# Patient Record
Sex: Male | Born: 1940 | Race: White | Hispanic: No | Marital: Married | Smoking: Former smoker
Health system: Southern US, Community
[De-identification: ages and names within clinical notes are randomized; demographics above are authoritative.]

## PROBLEM LIST (undated history)

## (undated) DIAGNOSIS — F419 Anxiety disorder, unspecified: Secondary | ICD-10-CM

## (undated) DIAGNOSIS — C4490 Unspecified malignant neoplasm of skin, unspecified: Secondary | ICD-10-CM

## (undated) DIAGNOSIS — N400 Enlarged prostate without lower urinary tract symptoms: Secondary | ICD-10-CM

## (undated) DIAGNOSIS — M5126 Other intervertebral disc displacement, lumbar region: Secondary | ICD-10-CM

## (undated) DIAGNOSIS — F329 Major depressive disorder, single episode, unspecified: Secondary | ICD-10-CM

## (undated) DIAGNOSIS — I10 Essential (primary) hypertension: Secondary | ICD-10-CM

## (undated) DIAGNOSIS — K589 Irritable bowel syndrome without diarrhea: Secondary | ICD-10-CM

## (undated) DIAGNOSIS — K449 Diaphragmatic hernia without obstruction or gangrene: Secondary | ICD-10-CM

## (undated) DIAGNOSIS — G454 Transient global amnesia: Secondary | ICD-10-CM

## (undated) DIAGNOSIS — F32A Depression, unspecified: Secondary | ICD-10-CM

## (undated) DIAGNOSIS — G4700 Insomnia, unspecified: Secondary | ICD-10-CM

## (undated) HISTORY — DX: Insomnia, unspecified: G47.00

## (undated) HISTORY — PX: TRANSURETHRAL RESECTION OF PROSTATE: SHX73

## (undated) HISTORY — DX: Benign prostatic hyperplasia without lower urinary tract symptoms: N40.0

## (undated) HISTORY — PX: SKIN CANCER EXCISION: SHX779

## (undated) HISTORY — DX: Other intervertebral disc displacement, lumbar region: M51.26

## (undated) HISTORY — DX: Anxiety disorder, unspecified: F41.9

## (undated) HISTORY — PX: CYSTECTOMY: SUR359

## (undated) HISTORY — DX: Transient global amnesia: G45.4

## (undated) HISTORY — DX: Unspecified malignant neoplasm of skin, unspecified: C44.90

---

## 1998-09-13 ENCOUNTER — Ambulatory Visit (HOSPITAL_COMMUNITY): Admission: RE | Admit: 1998-09-13 | Discharge: 1998-09-13 | Payer: Self-pay | Admitting: Gastroenterology

## 2002-04-23 ENCOUNTER — Emergency Department (HOSPITAL_COMMUNITY): Admission: EM | Admit: 2002-04-23 | Discharge: 2002-04-24 | Payer: Self-pay | Admitting: Emergency Medicine

## 2002-04-23 ENCOUNTER — Encounter: Payer: Self-pay | Admitting: Emergency Medicine

## 2002-04-26 ENCOUNTER — Encounter: Payer: Self-pay | Admitting: Urology

## 2002-04-27 ENCOUNTER — Ambulatory Visit (HOSPITAL_BASED_OUTPATIENT_CLINIC_OR_DEPARTMENT_OTHER): Admission: RE | Admit: 2002-04-27 | Discharge: 2002-04-27 | Payer: Self-pay | Admitting: Urology

## 2002-05-03 ENCOUNTER — Encounter: Payer: Self-pay | Admitting: Urology

## 2002-05-03 ENCOUNTER — Ambulatory Visit (HOSPITAL_BASED_OUTPATIENT_CLINIC_OR_DEPARTMENT_OTHER): Admission: RE | Admit: 2002-05-03 | Discharge: 2002-05-03 | Payer: Self-pay | Admitting: Urology

## 2003-06-28 ENCOUNTER — Ambulatory Visit (HOSPITAL_BASED_OUTPATIENT_CLINIC_OR_DEPARTMENT_OTHER): Admission: RE | Admit: 2003-06-28 | Discharge: 2003-06-28 | Payer: Self-pay | Admitting: *Deleted

## 2003-12-26 ENCOUNTER — Encounter: Admission: RE | Admit: 2003-12-26 | Discharge: 2004-03-25 | Payer: Self-pay | Admitting: Family Medicine

## 2004-01-09 ENCOUNTER — Encounter: Admission: RE | Admit: 2004-01-09 | Discharge: 2004-01-09 | Payer: Self-pay | Admitting: *Deleted

## 2004-05-29 ENCOUNTER — Ambulatory Visit (HOSPITAL_COMMUNITY): Admission: RE | Admit: 2004-05-29 | Discharge: 2004-05-29 | Payer: Self-pay | Admitting: Gastroenterology

## 2005-07-31 ENCOUNTER — Emergency Department (HOSPITAL_COMMUNITY): Admission: EM | Admit: 2005-07-31 | Discharge: 2005-07-31 | Payer: Self-pay | Admitting: Emergency Medicine

## 2010-10-21 ENCOUNTER — Encounter: Payer: Self-pay | Admitting: *Deleted

## 2014-07-31 ENCOUNTER — Observation Stay (HOSPITAL_COMMUNITY)
Admission: EM | Admit: 2014-07-31 | Discharge: 2014-08-01 | Disposition: A | Discharge status: Home / Self Care | Payer: Medicare Other | Attending: Emergency Medicine | Admitting: Emergency Medicine

## 2014-07-31 ENCOUNTER — Encounter (HOSPITAL_COMMUNITY): Payer: Self-pay | Admitting: Family Medicine

## 2014-07-31 ENCOUNTER — Emergency Department (HOSPITAL_COMMUNITY): Payer: Medicare Other

## 2014-07-31 DIAGNOSIS — K449 Diaphragmatic hernia without obstruction or gangrene: Secondary | ICD-10-CM | POA: Insufficient documentation

## 2014-07-31 DIAGNOSIS — R2990 Unspecified symptoms and signs involving the nervous system: Secondary | ICD-10-CM | POA: Diagnosis present

## 2014-07-31 DIAGNOSIS — G454 Transient global amnesia: Secondary | ICD-10-CM | POA: Diagnosis present

## 2014-07-31 DIAGNOSIS — R413 Other amnesia: Principal | ICD-10-CM | POA: Diagnosis present

## 2014-07-31 DIAGNOSIS — I10 Essential (primary) hypertension: Secondary | ICD-10-CM | POA: Insufficient documentation

## 2014-07-31 DIAGNOSIS — R410 Disorientation, unspecified: Secondary | ICD-10-CM | POA: Diagnosis present

## 2014-07-31 DIAGNOSIS — R412 Retrograde amnesia: Secondary | ICD-10-CM | POA: Diagnosis present

## 2014-07-31 DIAGNOSIS — R4182 Altered mental status, unspecified: Secondary | ICD-10-CM | POA: Diagnosis present

## 2014-07-31 HISTORY — DX: Depression, unspecified: F32.A

## 2014-07-31 HISTORY — DX: Irritable bowel syndrome, unspecified: K58.9

## 2014-07-31 HISTORY — DX: Diaphragmatic hernia without obstruction or gangrene: K44.9

## 2014-07-31 HISTORY — DX: Essential (primary) hypertension: I10

## 2014-07-31 HISTORY — DX: Major depressive disorder, single episode, unspecified: F32.9

## 2014-07-31 LAB — URINALYSIS, ROUTINE W REFLEX MICROSCOPIC
Bilirubin Urine: NEGATIVE
Glucose, UA: NEGATIVE mg/dL
Ketones, ur: NEGATIVE mg/dL
Leukocytes, UA: NEGATIVE
NITRITE: NEGATIVE
PROTEIN: NEGATIVE mg/dL
Specific Gravity, Urine: 1.043 — ABNORMAL HIGH (ref 1.005–1.030)
UROBILINOGEN UA: 0.2 mg/dL (ref 0.0–1.0)
pH: 5 (ref 5.0–8.0)

## 2014-07-31 LAB — DIFFERENTIAL
BASOS ABS: 0 10*3/uL (ref 0.0–0.1)
BASOS PCT: 0 % (ref 0–1)
EOS ABS: 0 10*3/uL (ref 0.0–0.7)
EOS PCT: 1 % (ref 0–5)
Lymphocytes Relative: 20 % (ref 12–46)
Lymphs Abs: 1.2 10*3/uL (ref 0.7–4.0)
Monocytes Absolute: 0.7 10*3/uL (ref 0.1–1.0)
Monocytes Relative: 12 % (ref 3–12)
NEUTROS PCT: 67 % (ref 43–77)
Neutro Abs: 4.1 10*3/uL (ref 1.7–7.7)

## 2014-07-31 LAB — URINE MICROSCOPIC-ADD ON

## 2014-07-31 LAB — I-STAT CHEM 8, ED
BUN: 19 mg/dL (ref 6–23)
CREATININE: 1.2 mg/dL (ref 0.50–1.35)
Calcium, Ion: 1.11 mmol/L — ABNORMAL LOW (ref 1.13–1.30)
Chloride: 107 mEq/L (ref 96–112)
Glucose, Bld: 97 mg/dL (ref 70–99)
HCT: 46 % (ref 39.0–52.0)
Hemoglobin: 15.6 g/dL (ref 13.0–17.0)
Potassium: 4.1 mEq/L (ref 3.7–5.3)
SODIUM: 140 meq/L (ref 137–147)
TCO2: 26 mmol/L (ref 0–100)

## 2014-07-31 LAB — CBC
HEMATOCRIT: 43.5 % (ref 39.0–52.0)
HEMOGLOBIN: 15.3 g/dL (ref 13.0–17.0)
MCH: 32 pg (ref 26.0–34.0)
MCHC: 35.2 g/dL (ref 30.0–36.0)
MCV: 91 fL (ref 78.0–100.0)
Platelets: 227 10*3/uL (ref 150–400)
RBC: 4.78 MIL/uL (ref 4.22–5.81)
RDW: 12.8 % (ref 11.5–15.5)
WBC: 6.1 10*3/uL (ref 4.0–10.5)

## 2014-07-31 LAB — COMPREHENSIVE METABOLIC PANEL
ALBUMIN: 3.8 g/dL (ref 3.5–5.2)
ALT: 19 U/L (ref 0–53)
ANION GAP: 11 (ref 5–15)
AST: 23 U/L (ref 0–37)
Alkaline Phosphatase: 55 U/L (ref 39–117)
BILIRUBIN TOTAL: 0.8 mg/dL (ref 0.3–1.2)
BUN: 16 mg/dL (ref 6–23)
CHLORIDE: 104 meq/L (ref 96–112)
CO2: 25 mEq/L (ref 19–32)
CREATININE: 1.13 mg/dL (ref 0.50–1.35)
Calcium: 9.5 mg/dL (ref 8.4–10.5)
GFR, EST AFRICAN AMERICAN: 71 mL/min — AB (ref >90)
GFR, EST NON AFRICAN AMERICAN: 62 mL/min — AB (ref >90)
GLUCOSE: 101 mg/dL — AB (ref 70–99)
Potassium: 4.1 mEq/L (ref 3.7–5.3)
Sodium: 140 mEq/L (ref 137–147)
Total Protein: 6.7 g/dL (ref 6.0–8.3)

## 2014-07-31 LAB — I-STAT TROPONIN, ED: TROPONIN I, POC: 0.01 ng/mL (ref 0.00–0.08)

## 2014-07-31 LAB — TSH: TSH: 1.24 u[IU]/mL (ref 0.350–4.500)

## 2014-07-31 LAB — CBG MONITORING, ED
GLUCOSE-CAPILLARY: 106 mg/dL — AB (ref 70–99)
GLUCOSE-CAPILLARY: 102 mg/dL — AB (ref 70–99)

## 2014-07-31 LAB — PROTIME-INR
INR: 1.09 (ref 0.00–1.49)
PROTHROMBIN TIME: 14.2 s (ref 11.6–15.2)

## 2014-07-31 LAB — APTT: APTT: 27 s (ref 24–37)

## 2014-07-31 MED ORDER — ONDANSETRON HCL 4 MG/2ML IJ SOLN: 4.0000 mg | Freq: Four times a day (QID) | INTRAMUSCULAR | Status: DC | PRN

## 2014-07-31 MED ORDER — ACETAMINOPHEN 325 MG PO TABS: 650.0000 mg | ORAL_TABLET | Freq: Four times a day (QID) | ORAL | Status: DC | PRN

## 2014-07-31 MED ORDER — SODIUM CHLORIDE 0.9 % IV SOLN
INTRAVENOUS | Status: DC
  Administered 2014-07-31: 19:00:00 via INTRAVENOUS

## 2014-07-31 MED ORDER — THIAMINE HCL 100 MG/ML IJ SOLN
100.0000 mg | Freq: Once | INTRAMUSCULAR | Status: AC
  Administered 2014-07-31: 100 mg via INTRAVENOUS
  Filled 2014-07-31: qty 2

## 2014-07-31 MED ORDER — IOHEXOL 350 MG/ML SOLN
50.0000 mL | Freq: Once | INTRAVENOUS | Status: AC | PRN
  Administered 2014-07-31: 50 mL via INTRAVENOUS

## 2014-07-31 MED ORDER — PSYLLIUM 95 % PO PACK
1.0000 | PACK | Freq: Every day | ORAL | Status: DC
  Administered 2014-07-31 – 2014-08-01 (×2): 1 via ORAL
  Filled 2014-07-31 (×7): qty 1

## 2014-07-31 MED ORDER — SERTRALINE HCL 100 MG PO TABS
100.0000 mg | ORAL_TABLET | Freq: Every day | ORAL | Status: DC
  Administered 2014-07-31 – 2014-08-01 (×2): 100 mg via ORAL
  Filled 2014-07-31 (×2): qty 1

## 2014-07-31 MED ORDER — TRAZODONE HCL 50 MG PO TABS
75.0000 mg | ORAL_TABLET | Freq: Every evening | ORAL | Status: DC | PRN
Start: 2014-07-31 — End: 2014-08-01
  Administered 2014-07-31: 75 mg via ORAL
  Filled 2014-07-31: qty 2

## 2014-07-31 MED ORDER — SODIUM CHLORIDE 0.9 % IV SOLN
Freq: Once | INTRAVENOUS | Status: AC
  Administered 2014-07-31: 16:00:00 via INTRAVENOUS

## 2014-07-31 MED ORDER — PROPYLENE GLYCOL 0.6 % OP SOLN: 1.0000 [drp] | Freq: Every day | OPHTHALMIC | Status: DC

## 2014-07-31 MED ORDER — ENOXAPARIN SODIUM 40 MG/0.4ML ~~LOC~~ SOLN
40.0000 mg | SUBCUTANEOUS | Status: DC
  Administered 2014-07-31: 40 mg via SUBCUTANEOUS
  Filled 2014-07-31: qty 0.4

## 2014-07-31 MED ORDER — SODIUM CHLORIDE 0.9 % IJ SOLN: 3.0000 mL | Freq: Two times a day (BID) | INTRAMUSCULAR | Status: DC

## 2014-07-31 MED ORDER — ONDANSETRON HCL 4 MG PO TABS: 4.0000 mg | ORAL_TABLET | Freq: Four times a day (QID) | ORAL | Status: DC | PRN

## 2014-07-31 MED ORDER — ACETAMINOPHEN 650 MG RE SUPP: 650.0000 mg | Freq: Four times a day (QID) | RECTAL | Status: DC | PRN

## 2014-07-31 MED ORDER — POLYVINYL ALCOHOL 1.4 % OP SOLN
1.0000 [drp] | Freq: Every day | OPHTHALMIC | Status: DC
  Administered 2014-07-31: 1 [drp] via OPHTHALMIC
  Filled 2014-07-31: qty 15

## 2014-07-31 MED ORDER — ALUM & MAG HYDROXIDE-SIMETH 200-200-20 MG/5ML PO SUSP: 30.0000 mL | Freq: Four times a day (QID) | ORAL | Status: DC | PRN

## 2014-07-31 NOTE — ED Provider Notes (Signed)
CSN: [id number]     Arrival date & time 07/31/14  1109 History   First MD Initiated Contact with Patient 07/31/14 1133     Chief Complaint  Patient presents with  . Altered Mental Status    Patient is a 73 y.o. male presenting with general illness. The history is provided by the patient and the spouse.  Illness Location:  Global Amnesia Quality:  Unable to remember recent events Severity:  Severe Onset quality:  Sudden Duration:  2 hours Timing:  Constant Progression:  Unchanged Chronicity:  New Context:  Had intercourse with spouse; spouse went to shower, came back and patient was asking "strange questions" Relieved by:  Nothing Worsened by:  Nothing Ineffective treatments:  None tried Associated symptoms: no abdominal pain, no chest pain, no fever, no headaches, no loss of consciousness, no nausea, no rash, no shortness of breath and no vomiting   Risk factors:  HTN   Past Medical History  Diagnosis Date  . Hypertension   . Hernia, hiatal    History reviewed. No pertinent past surgical history.   History reviewed. No pertinent family history.   History  Substance Use Topics  . Smoking status: Never Smoker   . Smokeless tobacco: Not on file  . Alcohol Use: Not on file    Review of Systems  Constitutional: Negative for fever.  Respiratory: Negative for shortness of breath.   Cardiovascular: Negative for chest pain.  Gastrointestinal: Negative for nausea, vomiting and abdominal pain.  Musculoskeletal: Negative for gait problem.  Skin: Negative for rash.  Neurological: Negative for loss of consciousness, weakness, numbness and headaches.  Psychiatric/Behavioral: Positive for confusion.  All other systems reviewed and are negative.   Allergies  Review of patient's allergies indicates not on file.  Home Medications   Prior to Admission medications   Not on File   BP 146/69 mmHg  Pulse 94  Temp(Src) 98 F (36.7 C) (Oral)  Resp 18  SpO2 94%   Physical  Exam  Constitutional: He appears well-developed and well-nourished. No distress.  HENT:  Head: Normocephalic and atraumatic.  Right Ear: External ear normal.  Left Ear: External ear normal.  Mouth/Throat: Oropharynx is clear and moist.  Eyes: EOM are normal. Pupils are equal, round, and reactive to light.  Neck: Normal range of motion.  Cardiovascular: Normal rate and regular rhythm.   Pulmonary/Chest: Effort normal and breath sounds normal. No respiratory distress. He has no wheezes. He has no rales.  Abdominal: Soft. He exhibits no distension. There is no tenderness. There is no rebound and no guarding.  Neurological: He is alert. He has normal strength. GCS eye subscore is 4. GCS verbal subscore is 4. GCS motor subscore is 6.  CN 3-12 intact; Normal finger to nose bilaterally; normal heel to shin bilaterally; repeats statements; when I left the room and returned he felt that he had never seen me before; normal strength and sensation in all extremities; oriented to person, guesses place correctly; unsure of the year  Skin: Skin is warm and dry. No rash noted. He is not diaphoretic.  Psychiatric: He has a normal mood and affect. His mood appears not anxious. His affect is not angry and not blunt. His speech is not slurred. He is not agitated, not hyperactive, not slowed, not withdrawn and not actively hallucinating. He is attentive.  Vitals reviewed.   ED Course  Procedures (including critical care time) Labs Review  Results for orders placed or performed during the hospital encounter of 07/31/14  Protime-INR  Result Value Ref Range   Prothrombin Time 14.2 11.6 - 15.2 seconds   INR 1.09 0.00 - 1.49  APTT  Result Value Ref Range   aPTT 27 24 - 37 seconds  CBC  Result Value Ref Range   WBC 6.1 4.0 - 10.5 K/uL   RBC 4.78 4.22 - 5.81 MIL/uL   Hemoglobin 15.3 13.0 - 17.0 g/dL   HCT 43.5 39.0 - 52.0 %   MCV 91.0 78.0 - 100.0 fL   MCH 32.0 26.0 - 34.0 pg   MCHC 35.2 30.0 - 36.0 g/dL    RDW 12.8 11.5 - 15.5 %   Platelets 227 150 - 400 K/uL  Differential  Result Value Ref Range   Neutrophils Relative % 67 43 - 77 %   Neutro Abs 4.1 1.7 - 7.7 K/uL   Lymphocytes Relative 20 12 - 46 %   Lymphs Abs 1.2 0.7 - 4.0 K/uL   Monocytes Relative 12 3 - 12 %   Monocytes Absolute 0.7 0.1 - 1.0 K/uL   Eosinophils Relative 1 0 - 5 %   Eosinophils Absolute 0.0 0.0 - 0.7 K/uL   Basophils Relative 0 0 - 1 %   Basophils Absolute 0.0 0.0 - 0.1 K/uL  Comprehensive metabolic panel  Result Value Ref Range   Sodium 140 137 - 147 mEq/L   Potassium 4.1 3.7 - 5.3 mEq/L   Chloride 104 96 - 112 mEq/L   CO2 25 19 - 32 mEq/L   Glucose, Bld 101 (H) 70 - 99 mg/dL   BUN 16 6 - 23 mg/dL   Creatinine, Ser 1.13 0.50 - 1.35 mg/dL   Calcium 9.5 8.4 - 10.5 mg/dL   Total Protein 6.7 6.0 - 8.3 g/dL   Albumin 3.8 3.5 - 5.2 g/dL   AST 23 0 - 37 U/L   ALT 19 0 - 53 U/L   Alkaline Phosphatase 55 39 - 117 U/L   Total Bilirubin 0.8 0.3 - 1.2 mg/dL   GFR calc non Af Amer 62 (L) >90 mL/min   GFR calc Af Amer 71 (L) >90 mL/min   Anion gap 11 5 - 15  CBG monitoring, ED  Result Value Ref Range   Glucose-Capillary 106 (H) 70 - 99 mg/dL  CBG monitoring, ED  Result Value Ref Range   Glucose-Capillary 102 (H) 70 - 99 mg/dL  I-stat troponin, ED (not at [facility])  Result Value Ref Range   Troponin i, poc 0.01 0.00 - 0.08 ng/mL   Comment 3          I-stat chem 8, ed  Result Value Ref Range   Sodium 140 137 - 147 mEq/L   Potassium 4.1 3.7 - 5.3 mEq/L   Chloride 107 96 - 112 mEq/L   BUN 19 6 - 23 mg/dL   Creatinine, Ser 1.20 0.50 - 1.35 mg/dL   Glucose, Bld 97 70 - 99 mg/dL   Calcium, Ion 1.11 (L) 1.13 - 1.30 mmol/L   TCO2 26 0 - 100 mmol/L   Hemoglobin 15.6 13.0 - 17.0 g/dL   HCT 46.0 39.0 - 52.0 %    Imaging Review Ct Angio Head W/cm &/or Wo Cm  07/31/2014   CLINICAL DATA:  Code stroke  EXAM: CT ANGIOGRAPHY HEAD AND NECK  TECHNIQUE: Multidetector CT imaging of the head and neck was performed using the  standard protocol during bolus administration of intravenous contrast. Multiplanar CT image reconstructions and MIPs were obtained to evaluate the vascular anatomy. Carotid  stenosis measurements (when applicable) are obtained utilizing NASCET criteria, using the distal internal carotid diameter as the denominator.  CONTRAST:  41mL OMNIPAQUE IOHEXOL 350 MG/ML SOLN  COMPARISON:  CT head 07/31/2014  FINDINGS: CTA HEAD FINDINGS  CT head reported separately.  Left vertebral dominant. Both vertebral arteries patent to the basilar. PICA, superior cerebellar and posterior cerebral arteries are widely patent. Right AICA also noted.  Cavernous carotid widely patent bilaterally with minimal atherosclerotic calcification. No significant carotid stenosis. Anterior and middle cerebral arteries are normal bilaterally without stenosis.  Negative for cerebral aneurysm.  Dural sinuses are clear without thrombosis.  Review of the MIP images confirms the above findings.  CTA NECK FINDINGS  Three vessel aortic arch. Aortic arch negative. Proximal great vessels are widely patent.  Right carotid: Right common carotid widely patent. Right carotid bifurcation normal. No significant atherosclerotic disease. Negative for dissection or carotid stenosis  Left carotid: Left common carotid artery is normal. Left carotid bifurcation is normal. No atherosclerotic disease or dissection noted.  Vertebral arteries: Left vertebral artery dominant. Both vertebral arteries are patent to the basilar without stenosis or dissection.  Cervical spondylosis noted diffusely most prominent at C5-6 and C6-7. No acute bony change.  Review of the MIP images confirms the above findings.  IMPRESSION: Negative CTA of the head  Negative CTA of the neck.  Critical Value/emergent results were called by telephone at the time of interpretation on 07/31/2014 at 1:12 pm to Dr. Collier Salina, Janann Colonel , who verbally acknowledged these results.   Electronically Signed   By: Franchot Gallo M.D.   On: 07/31/2014 13:13   Ct Head Wo Contrast  07/31/2014   CLINICAL DATA:  Sudden onset of confusion.  EXAM: CT HEAD WITHOUT CONTRAST  TECHNIQUE: Contiguous axial images were obtained from the base of the skull through the vertex without intravenous contrast.  COMPARISON:  CT 07/31/2005  FINDINGS: No intra-axial or extra-axial pathologic fluid or blood collection. No mass lesion. No hydrocephalus. No hemorrhage. No acute bony abnormality. Old nasal fractures. Mild mucosal thickening ethmoid or maxillary sinuses.  IMPRESSION: No acute abnormality.   Electronically Signed   By: Marcello Moores  Register   On: 07/31/2014 12:08   Ct Angio Neck W/cm &/or Wo/cm  07/31/2014   CLINICAL DATA:  Code stroke  EXAM: CT ANGIOGRAPHY HEAD AND NECK  TECHNIQUE: Multidetector CT imaging of the head and neck was performed using the standard protocol during bolus administration of intravenous contrast. Multiplanar CT image reconstructions and MIPs were obtained to evaluate the vascular anatomy. Carotid stenosis measurements (when applicable) are obtained utilizing NASCET criteria, using the distal internal carotid diameter as the denominator.  CONTRAST:  72mL OMNIPAQUE IOHEXOL 350 MG/ML SOLN  COMPARISON:  CT head 07/31/2014  FINDINGS: CTA HEAD FINDINGS  CT head reported separately.  Left vertebral dominant. Both vertebral arteries patent to the basilar. PICA, superior cerebellar and posterior cerebral arteries are widely patent. Right AICA also noted.  Cavernous carotid widely patent bilaterally with minimal atherosclerotic calcification. No significant carotid stenosis. Anterior and middle cerebral arteries are normal bilaterally without stenosis.  Negative for cerebral aneurysm.  Dural sinuses are clear without thrombosis.  Review of the MIP images confirms the above findings.  CTA NECK FINDINGS  Three vessel aortic arch. Aortic arch negative. Proximal great vessels are widely patent.  Right carotid: Right common carotid  widely patent. Right carotid bifurcation normal. No significant atherosclerotic disease. Negative for dissection or carotid stenosis  Left carotid: Left common carotid artery is normal. Left  carotid bifurcation is normal. No atherosclerotic disease or dissection noted.  Vertebral arteries: Left vertebral artery dominant. Both vertebral arteries are patent to the basilar without stenosis or dissection.  Cervical spondylosis noted diffusely most prominent at C5-6 and C6-7. No acute bony change.  Review of the MIP images confirms the above findings.  IMPRESSION: Negative CTA of the head  Negative CTA of the neck.  Critical Value/emergent results were called by telephone at the time of interpretation on 07/31/2014 at 1:12 pm to Dr. Collier Salina, Janann Colonel , who verbally acknowledged these results.   Electronically Signed   By: Franchot Gallo M.D.   On: 07/31/2014 13:13    EKG #1 Rate 58, NSR with PACs, PR 147, QTc 486, No ST elevation or depression  EKG #2 Rate 64, NSR with PACs, PR 149, QTc 469, No ST elevation or depression  MDM   Final diagnoses:  Confusion  Amnesia    73 y.o. male with a history of HTN, Hiatal Hernia, Depression. Presents for amnesia and asking "strange questions" per wife. This began suddenly around 10:30am when the wife got out of the shower. They had just had intercourse. Last seen normal around 10am. No focal weakness, coordination problems, speech problems. Denies pain anywhere including headache, neck pain, chest pain. No recent fever. Benign neuro exam. Able to repeat words immediately, unable to remember them after a delay.   He was taken from triage to get a CT head. CT head negative.  Code Stroke called. Neuro saw and evaluated the patient. Recommended EEG, MRI brain w/o contrast, and IV thiamine.   Labs notable for slightly decreased GFR. Otherwise unremarkable. CTA Head and Neck negative.   He was admitted to the hospitalist for further evaluation and management.   This  case managed in conjunction with my attending, Dr. Reather Converse.  Berenice Primas, MD 07/31/14 [phone]

## 2014-07-31 NOTE — ED Notes (Signed)
PT became bradycardic in CT - 38-48.  EKG given to MD.  Pt now ao x 4, though continues to forget staff.

## 2014-07-31 NOTE — Consult Note (Signed)
Consult Reason for Consult:acute onset confusion Referring Physician: Dr Reather Converse  CC: confusion  HPI: Stuart Huynh is an 73 y.o. male with unremarkable past medical history presenting for acute onset of forgetfulness. Patient woke up his normal self, post-coital was noted to be confused and disoriented around 10am. Wife notes he could not recall things that just happened and had difficulty recalling things from the recent past.   Brought to ED, initial head CT normal. CT angio of head and neck were normal.  Past Medical History  Diagnosis Date  . Hypertension   . Hernia, hiatal     History reviewed. No pertinent past surgical history.  History reviewed. No pertinent family history.  Social History:  reports that he has never smoked. He does not have any smokeless tobacco history on file. His alcohol and drug histories are not on file.  Allergies  Allergen Reactions  . Sulfa Antibiotics Rash    Medications: Prior to Admission:  (Not in a hospital admission)  ROS: Out of a complete 14 system review, the patient complains of only the following symptoms, and all other reviewed systems are negative.  Physical Examination: Blood pressure 144/72, pulse 83, temperature 98 F (36.7 C), temperature source Oral, resp. rate 14, SpO2 97 %.  Neurologic Examination Mental Status: Alert, oriented, thought content appropriate.  Oriented to name, location. Unclear on date. Immediate recall 3/3, delayed 0/3. Speech fluent without evidence of aphasia.  Able to follow 3 step commands without difficulty. Cranial Nerves: II: funduscopic exam wnl bilaterally, visual fields grossly normal, pupils equal, round, reactive to light and accommodation III,IV, VI: ptosis not present, extra-ocular motions intact bilaterally V,VII: smile symmetric, facial light touch sensation normal bilaterally VIII: hearing normal bilaterally IX,X: gag reflex present XI: trapezius strength/neck flexion strength normal  bilaterally XII: tongue strength normal  Motor: Right : Upper extremity    Left:     Upper extremity 5/5 deltoid       5/5 deltoid 5/5 biceps      5/5 biceps  5/5 triceps      5/5 triceps 5/5 hand grip      5/5 hand grip  Lower extremity     Lower extremity 5/5 hip flexor      5/5 hip flexor 5/5 quadricep      5/5 quadriceps  5/5 hamstrings     5/5 hamstrings 5/5 plantar flexion       5/5 plantar flexion 5/5 plantar extension     5/5 plantar extension Tone and bulk:normal tone throughout; no atrophy noted Sensory: Pinprick and light touch intact throughout, bilaterally Deep Tendon Reflexes: 2+ and symmetric throughout Plantars: Right: downgoing   Left: downgoing Cerebellar: normal finger-to-nose, normal rapid alternating movements and normal heel-to-shin test Gait: normal gait and station  Laboratory Studies:   Basic Metabolic Panel:  Recent Labs Lab 07/31/14 1147 07/31/14 1239  NA 140 140  K 4.1 4.1  CL 104 107  CO2 25  --   GLUCOSE 101* 97  BUN 16 19  CREATININE 1.13 1.20  CALCIUM 9.5  --     Liver Function Tests:  Recent Labs Lab 07/31/14 1147  AST 23  ALT 19  ALKPHOS 55  BILITOT 0.8  PROT 6.7  ALBUMIN 3.8   No results for input(s): LIPASE, AMYLASE in the last 168 hours. No results for input(s): AMMONIA in the last 168 hours.  CBC:  Recent Labs Lab 07/31/14 1147 07/31/14 1239  WBC 6.1  --   NEUTROABS 4.1  --  HGB 15.3 15.6  HCT 43.5 46.0  MCV 91.0  --   PLT 227  --     Cardiac Enzymes: No results for input(s): CKTOTAL, CKMB, CKMBINDEX, TROPONINI in the last 168 hours.  BNP: Invalid input(s): POCBNP  CBG:  Recent Labs Lab 07/31/14 1124 07/31/14 1159  GLUCAP 106* 102*    Microbiology: No results found for this or any previous visit.  Coagulation Studies:  Recent Labs  07/31/14 1147  LABPROT 14.2  INR 1.09    Urinalysis: No results for input(s): COLORURINE, LABSPEC, PHURINE, GLUCOSEU, HGBUR, BILIRUBINUR, KETONESUR,  PROTEINUR, UROBILINOGEN, NITRITE, LEUKOCYTESUR in the last 168 hours.  Invalid input(s): APPERANCEUR  Lipid Panel:  No results found for: CHOL, TRIG, HDL, CHOLHDL, VLDL, LDLCALC  HgbA1C: No results found for: HGBA1C  Urine Drug Screen:  No results found for: LABOPIA, COCAINSCRNUR, LABBENZ, AMPHETMU, THCU, LABBARB  Alcohol Level: No results for input(s): ETH in the last 168 hours.  Other results:  Imaging: Ct Head Wo Contrast  07/31/2014   CLINICAL DATA:  Sudden onset of confusion.  EXAM: CT HEAD WITHOUT CONTRAST  TECHNIQUE: Contiguous axial images were obtained from the base of the skull through the vertex without intravenous contrast.  COMPARISON:  CT 07/31/2005  FINDINGS: No intra-axial or extra-axial pathologic fluid or blood collection. No mass lesion. No hydrocephalus. No hemorrhage. No acute bony abnormality. Old nasal fractures. Mild mucosal thickening ethmoid or maxillary sinuses.  IMPRESSION: No acute abnormality.   Electronically Signed   By: Marcello Moores  Register   On: 07/31/2014 12:08     Assessment/Plan:  73y/o gentleman presenting for evaluation of acute onset of difficulty with short term memory. Exam overall unremarkable with exception of markedly impaired ability to form new memories. Has mild retrograde amnesia. Symptoms are most consistent with a possible transient global amnesia.   -check MRI brain without contrast -EEG -give IV thiamine 100mg  x 1   Jim Like, DO [facility] [phone]  If 7pm- 7am, please page neurology on call as listed in [place]. 07/31/2014, 1:14 PM

## 2014-07-31 NOTE — ED Notes (Signed)
Resident updating family on plan of care

## 2014-07-31 NOTE — ED Notes (Signed)
Meal tray ordered. Pt updated.

## 2014-07-31 NOTE — ED Notes (Signed)
Neurologist at bedside. 

## 2014-07-31 NOTE — Code Documentation (Addendum)
73 year old presents to [facility] via pvt vehicle with sx of amnesia.  He was triaged to CT scan and code stroke was called by EDP after exam.  On arrival alert  - speech clear - no weakness noted - has amnesia to events of AM - states he does not even remember last pm - wife states he woke in his usual state this AM - she noted the confusion and amnesia post-coital this AM around 1030.  CT done.  NIHSS 2 for questions only.  No other focal deficits noted.  Taken to CTA of head and neck.  Dr. Janann Colonel present.  Post CTA patient is clearing - has some memory return at this point - wife states not quite his baseline but much better.  No acute intervention per Dr. Janann Colonel.  HR now in 30-45 range with some PAC's noted -patient denies sx -EDP aware - handoff to [employer].  Wife updated.

## 2014-07-31 NOTE — ED Notes (Signed)
Per pt family pt had sudden onset of confusion this am around 10:30. sts that she thought he was joking and then realized he didn't know the date and time. Pt confused at triage. No other deficits noted at triage. Pt screened by Dr. Venora Maples at triage and was told not to call code stroke at this time.

## 2014-07-31 NOTE — ED Provider Notes (Signed)
ECG interpretation   Date: 07/31/2014  Rate: 61  Rhythm: normal sinus rhythm  QRS Axis: normal  Intervals: normal  ST/T Wave abnormalities: normal  Conduction Disutrbances: none  Narrative Interpretation: /with PACs  Old EKG Reviewed: no prior ecg available     Hoy Morn, MD 07/31/14 1240

## 2014-07-31 NOTE — H&P (Addendum)
[facility] History and Physical  Stuart Huynh EGB:[id number] DOB: 1941-05-07 DOA: 07/31/2014  Referring physician:  PCP: No primary care provider on file.   Chief Complaint: Amnesia  HPI: Stuart Huynh is a 73 y.o. male  With a past medical history of hypertension, depression, who was in his usual state health until approximately 10:30 this morning where he was noted by his wife to be asking "strange questions." They had intercourse earlier in the morning. His wife stated that he asked questions about events which they have been planning for the last several months and was unlike him to be asking those queastions. This was accompanied with disorientation and inability to recall recent events or even recognize certain family members in pictures. He asked the same question multiple times to family members. His wife also noticed him being a little slower to respond. During my encounter he cannot recall this mornings events however could tell me what he did yesterday evening for Halloween. He is able to recognize his family members who are present at bedside. He denies recent head trauma, fevers, chills, headache, nausea, vomiting. Patient had a head CT in the emergency room which was negative. A code stroke had been called for which neurology was consulted. Dr. Janann Colonel recommended MRI and EEG. Patient otherwise having a nonfocal neurologic examination.                                                                                                                                                                                                     Review of Systems:  Constitutional:  No weight loss, night sweats, Fevers, chills, fatigue.  HEENT:  No headaches, Difficulty swallowing,Tooth/dental problems,Sore throat,  No sneezing, itching, ear ache, nasal congestion, post nasal drip,  Cardio-vascular:  No chest pain, Orthopnea, PND, swelling in lower extremities, anasarca, dizziness,  palpitations  GI:  No heartburn, indigestion, abdominal pain, nausea, vomiting, diarrhea, change in bowel habits, loss of appetite  Resp:  No shortness of breath with exertion or at rest. No excess mucus, no productive cough, No non-productive cough, No coughing up of blood.No change in color of mucus.No wheezing.No chest wall deformity  Skin:  no rash or lesions.  GU:  no dysuria, change in color of urine, no urgency or frequency. No flank pain.  Musculoskeletal:  No joint pain or swelling. No decreased range of motion. No back pain.  Psych:  Patient stating unable to recall events earlier in the day  Past Medical History  Diagnosis Date  . Hypertension   . Hernia, hiatal    History reviewed. No  pertinent past surgical history. Social History:  reports that he has never smoked. He does not have any smokeless tobacco history on file. His alcohol and drug histories are not on file.  Allergies  Allergen Reactions  . Sulfa Antibiotics Rash    History reviewed. No pertinent family history.   Prior to Admission medications   Medication Sig Start Date End Date Taking? Authorizing Provider  clotrimazole (LOTRIMIN) 1 % cream Apply 1 application topically daily as needed (for rash).   Yes Historical Provider, MD  hydrochlorothiazide (HYDRODIURIL) 25 MG tablet Take 12.5 mg by mouth daily.   Yes Historical Provider, MD  Multiple Vitamin (MULTIVITAMIN WITH MINERALS) TABS tablet Take 1 tablet by mouth daily.   Yes Historical Provider, MD  Propylene Glycol (SYSTANE BALANCE) 0.6 % SOLN Apply 1 drop to eye at bedtime.   Yes Historical Provider, MD  psyllium (METAMUCIL) 58.6 % powder Take 1 packet by mouth 5 (five) times daily.   Yes Historical Provider, MD  sertraline (ZOLOFT) 100 MG tablet Take 100 mg by mouth daily.   Yes Historical Provider, MD  traZODone (DESYREL) 150 MG tablet Take 75 mg by mouth at bedtime.   Yes Historical Provider, MD   Physical Exam: Filed Vitals:   07/31/14 1615  07/31/14 1630 07/31/14 1645 07/31/14 1705  BP: 139/86 117/75 140/98 139/72  Pulse: 39 93 45 95  Temp:      TempSrc:      Resp: 15 18 17 18   SpO2: 96% 93% 95% 96%    Wt Readings from Last 3 Encounters:  No data found for Wt    General:  Appears calm and comfortable Eyes: PERRL, normal lids, irises & conjunctiva ENT: grossly normal hearing, lips & tongue Neck: no LAD, masses or thyromegaly Cardiovascular: RRR, no m/r/g. No LE edema. Telemetry: SR, no arrhythmias  Respiratory: CTA bilaterally, no w/r/r. Normal respiratory effort. Abdomen: soft, ntnd Skin: no rash or induration seen on limited exam Musculoskeletal: grossly normal tone BUE/BLE Psychiatric: grossly normal mood and affect, speech fluent and appropriate Neurologic: grossly non-focal.          Labs on Admission:  Basic Metabolic Panel:  Recent Labs Lab 07/31/14 1147 07/31/14 1239  NA 140 140  K 4.1 4.1  CL 104 107  CO2 25  --   GLUCOSE 101* 97  BUN 16 19  CREATININE 1.13 1.20  CALCIUM 9.5  --    Liver Function Tests:  Recent Labs Lab 07/31/14 1147  AST 23  ALT 19  ALKPHOS 55  BILITOT 0.8  PROT 6.7  ALBUMIN 3.8   No results for input(s): LIPASE, AMYLASE in the last 168 hours. No results for input(s): AMMONIA in the last 168 hours. CBC:  Recent Labs Lab 07/31/14 1147 07/31/14 1239  WBC 6.1  --   NEUTROABS 4.1  --   HGB 15.3 15.6  HCT 43.5 46.0  MCV 91.0  --   PLT 227  --    Cardiac Enzymes: No results for input(s): CKTOTAL, CKMB, CKMBINDEX, TROPONINI in the last 168 hours.  BNP (last 3 results) No results for input(s): PROBNP in the last 8760 hours. CBG:  Recent Labs Lab 07/31/14 1124 07/31/14 1159  GLUCAP 106* 102*    Radiological Exams on Admission: Ct Angio Head W/cm &/or Wo Cm  07/31/2014   CLINICAL DATA:  Code stroke  EXAM: CT ANGIOGRAPHY HEAD AND NECK  TECHNIQUE: Multidetector CT imaging of the head and neck was performed using the standard protocol during bolus  administration of intravenous contrast. Multiplanar CT image reconstructions and MIPs were obtained to evaluate the vascular anatomy. Carotid stenosis measurements (when applicable) are obtained utilizing NASCET criteria, using the distal internal carotid diameter as the denominator.  CONTRAST:  67mL OMNIPAQUE IOHEXOL 350 MG/ML SOLN  COMPARISON:  CT head 07/31/2014  FINDINGS: CTA HEAD FINDINGS  CT head reported separately.  Left vertebral dominant. Both vertebral arteries patent to the basilar. PICA, superior cerebellar and posterior cerebral arteries are widely patent. Right AICA also noted.  Cavernous carotid widely patent bilaterally with minimal atherosclerotic calcification. No significant carotid stenosis. Anterior and middle cerebral arteries are normal bilaterally without stenosis.  Negative for cerebral aneurysm.  Dural sinuses are clear without thrombosis.  Review of the MIP images confirms the above findings.  CTA NECK FINDINGS  Three vessel aortic arch. Aortic arch negative. Proximal great vessels are widely patent.  Right carotid: Right common carotid widely patent. Right carotid bifurcation normal. No significant atherosclerotic disease. Negative for dissection or carotid stenosis  Left carotid: Left common carotid artery is normal. Left carotid bifurcation is normal. No atherosclerotic disease or dissection noted.  Vertebral arteries: Left vertebral artery dominant. Both vertebral arteries are patent to the basilar without stenosis or dissection.  Cervical spondylosis noted diffusely most prominent at C5-6 and C6-7. No acute bony change.  Review of the MIP images confirms the above findings.  IMPRESSION: Negative CTA of the head  Negative CTA of the neck.  Critical Value/emergent results were called by telephone at the time of interpretation on 07/31/2014 at 1:12 pm to Dr. Collier Salina, Janann Colonel , who verbally acknowledged these results.   Electronically Signed   By: Franchot Gallo M.D.   On: 07/31/2014 13:13     Ct Head Wo Contrast  07/31/2014   CLINICAL DATA:  Sudden onset of confusion.  EXAM: CT HEAD WITHOUT CONTRAST  TECHNIQUE: Contiguous axial images were obtained from the base of the skull through the vertex without intravenous contrast.  COMPARISON:  CT 07/31/2005  FINDINGS: No intra-axial or extra-axial pathologic fluid or blood collection. No mass lesion. No hydrocephalus. No hemorrhage. No acute bony abnormality. Old nasal fractures. Mild mucosal thickening ethmoid or maxillary sinuses.  IMPRESSION: No acute abnormality.   Electronically Signed   By: Marcello Moores  Register   On: 07/31/2014 12:08   Ct Angio Neck W/cm &/or Wo/cm  07/31/2014   CLINICAL DATA:  Code stroke  EXAM: CT ANGIOGRAPHY HEAD AND NECK  TECHNIQUE: Multidetector CT imaging of the head and neck was performed using the standard protocol during bolus administration of intravenous contrast. Multiplanar CT image reconstructions and MIPs were obtained to evaluate the vascular anatomy. Carotid stenosis measurements (when applicable) are obtained utilizing NASCET criteria, using the distal internal carotid diameter as the denominator.  CONTRAST:  46mL OMNIPAQUE IOHEXOL 350 MG/ML SOLN  COMPARISON:  CT head 07/31/2014  FINDINGS: CTA HEAD FINDINGS  CT head reported separately.  Left vertebral dominant. Both vertebral arteries patent to the basilar. PICA, superior cerebellar and posterior cerebral arteries are widely patent. Right AICA also noted.  Cavernous carotid widely patent bilaterally with minimal atherosclerotic calcification. No significant carotid stenosis. Anterior and middle cerebral arteries are normal bilaterally without stenosis.  Negative for cerebral aneurysm.  Dural sinuses are clear without thrombosis.  Review of the MIP images confirms the above findings.  CTA NECK FINDINGS  Three vessel aortic arch. Aortic arch negative. Proximal great vessels are widely patent.  Right carotid: Right common carotid widely patent. Right carotid  bifurcation normal. No  significant atherosclerotic disease. Negative for dissection or carotid stenosis  Left carotid: Left common carotid artery is normal. Left carotid bifurcation is normal. No atherosclerotic disease or dissection noted.  Vertebral arteries: Left vertebral artery dominant. Both vertebral arteries are patent to the basilar without stenosis or dissection.  Cervical spondylosis noted diffusely most prominent at C5-6 and C6-7. No acute bony change.  Review of the MIP images confirms the above findings.  IMPRESSION: Negative CTA of the head  Negative CTA of the neck.  Critical Value/emergent results were called by telephone at the time of interpretation on 07/31/2014 at 1:12 pm to Dr. Collier Salina, Janann Colonel , who verbally acknowledged these results.   Electronically Signed   By: Franchot Gallo M.D.   On: 07/31/2014 13:13    EKG: Independently reviewed.   Assessment/Plan Principal Problem:   Amnesia (retrograde) Active Problems:   Amnesia   Confusion   Stroke-like symptom   1. Possible transient global amnesia. Family members reporting that patient had been in his usual state of health until this morning where family noticed him to be disoriented, having retrograde amnesia and repeating the same question multiple times. Workup in the emergency department included a CT scan of brain which was unremarkable. He had a non-focal neurologic examination with the rest of his lab work otherwise unremarkable. Patient was seen and evaluated by neurology who has recommended obtaining MRI of brain and EEG. Will place him in overnight observation on continuous cardiac monitoring.  2. History of hypertension. Patient on hydrochlorothiazide therapy at home, having creatinine of 1.2. Will hold diuretic therapy for now, provide gentle IV fluid hydration overnight. Blood pressures otherwise stable with his last blood pressure reading of 131/76 3. Bradycardia. Patient having heart rates in the 40s to 50s in the  emergency department. He is not taking nodal agents, EKG reveals sinus rhythm with presence of PAC's. Will place him on telemetry, repeat EKG in a.m. 4. Major depression. Continue Zoloft 100 mg by mouth daily 5.  DVT prophylaxis Lovenox  Code Status: Full code Family Communication: Spoke with his wife and daughter who were present at bedside Disposition Plan: Do not anticipate he'll require greater than 2 nights hospitalization, will place him in overnight observation  Time spent: 50 min  Kelvin Cellar [facility] Pager [phone]

## 2014-07-31 NOTE — ED Notes (Signed)
Pt remains monitored by blood pressure, pulse ox, and 12 lead. Pts family remains at bedside.  

## 2014-08-01 ENCOUNTER — Observation Stay (HOSPITAL_COMMUNITY): Payer: Medicare Other

## 2014-08-01 DIAGNOSIS — I10 Essential (primary) hypertension: Secondary | ICD-10-CM | POA: Diagnosis not present

## 2014-08-01 DIAGNOSIS — K449 Diaphragmatic hernia without obstruction or gangrene: Secondary | ICD-10-CM | POA: Diagnosis not present

## 2014-08-01 DIAGNOSIS — R413 Other amnesia: Secondary | ICD-10-CM | POA: Diagnosis not present

## 2014-08-01 DIAGNOSIS — R412 Retrograde amnesia: Secondary | ICD-10-CM

## 2014-08-01 DIAGNOSIS — G454 Transient global amnesia: Secondary | ICD-10-CM | POA: Diagnosis present

## 2014-08-01 DIAGNOSIS — I359 Nonrheumatic aortic valve disorder, unspecified: Secondary | ICD-10-CM

## 2014-08-01 DIAGNOSIS — R410 Disorientation, unspecified: Secondary | ICD-10-CM | POA: Diagnosis not present

## 2014-08-01 LAB — CBC
HCT: 39.3 % (ref 39.0–52.0)
HEMOGLOBIN: 13.5 g/dL (ref 13.0–17.0)
MCH: 31.6 pg (ref 26.0–34.0)
MCHC: 34.4 g/dL (ref 30.0–36.0)
MCV: 92 fL (ref 78.0–100.0)
Platelets: 210 10*3/uL (ref 150–400)
RBC: 4.27 MIL/uL (ref 4.22–5.81)
RDW: 13.1 % (ref 11.5–15.5)
WBC: 4.8 10*3/uL (ref 4.0–10.5)

## 2014-08-01 LAB — BASIC METABOLIC PANEL
Anion gap: 11 (ref 5–15)
BUN: 18 mg/dL (ref 6–23)
CHLORIDE: 105 meq/L (ref 96–112)
CO2: 25 meq/L (ref 19–32)
Calcium: 8.7 mg/dL (ref 8.4–10.5)
Creatinine, Ser: 1.1 mg/dL (ref 0.50–1.35)
GFR calc non Af Amer: 64 mL/min — ABNORMAL LOW (ref >90)
GFR, EST AFRICAN AMERICAN: 74 mL/min — AB (ref >90)
GLUCOSE: 91 mg/dL (ref 70–99)
Potassium: 3.7 mEq/L (ref 3.7–5.3)
Sodium: 141 mEq/L (ref 137–147)

## 2014-08-01 MED ORDER — ASPIRIN EC 325 MG PO TBEC
325.0000 mg | DELAYED_RELEASE_TABLET | Freq: Every day | ORAL | Status: DC
  Administered 2014-08-01: 325 mg via ORAL
  Filled 2014-08-01: qty 1

## 2014-08-01 NOTE — Plan of Care (Signed)
Problem: Consults Goal: Skin Care Protocol Initiated - if Braden Score 18 or less If consults are not indicated, leave blank or document N/A Outcome: Not Applicable Date Met:  03/75/43

## 2014-08-01 NOTE — Plan of Care (Signed)
Problem: Discharge Progression Outcomes Goal: Other Discharge Outcomes/Goals Outcome: Completed/Met Date Met:  08/01/14     

## 2014-08-01 NOTE — Progress Notes (Signed)
Subjective: Patient had no new complaints. No overnight adverse events reported. He said no difficulty with short-term memory today.  Objective: Current vital signs: BP 107/51 mmHg  Pulse 38  Temp(Src) 98.2 F (36.8 C) (Oral)  Resp 18  Ht 5\' 11"  (1.803 m)  Wt 77.747 kg (171 lb 6.4 oz)  BMI 23.92 kg/m2  SpO2 95%  Neurologic Exam: Alert and in no acute distress. Patient was well-oriented to time as well as place. There was no expressive nor receptive aphasia. Short-term memory was normal.  MRI of the brain without contrast showed no signs of an acute stroke.small vessel disease type changes were noted.  EEG has been obtained. Results of pending.  Medications: I have reviewed the patient's current medications.  Assessment/Plan: 73 year old man admitted following onset of transient global amnesia, which has since resolved. He has no memory difficulty at this point.  No further neurodiagnostic studies are indicated. Recommend follow-up with his primary physician on routine basis. Also recommend he continue aspirin daily. No objection to discharging patient home at this point  C.R. Nicole Kindred, MD [facility] [phone]  08/01/2014  11:23 AM

## 2014-08-01 NOTE — Discharge Summary (Addendum)
Physician Discharge Summary  SAYLOR SHECKLER MRN: [id number] DOB/AGE: 02-Nov-1940 73 y.o.  PCP: No primary care provider on file.   Admit date: 07/31/2014 Discharge date: 08/01/2014  Discharge Diagnoses:     Amnesia (retrograde) Hypotension    Amnesia   Confusion   Stroke-like symptom Resting bradycardia   Follow-up recommendations Follow-up with PCP in 5-7 days Patient may need an event monitor, for his PACs and resting bradycardia PCP to follow-up on the results of the 2-D echo     Medication List    STOP taking these medications        hydrochlorothiazide 25 MG tablet  Commonly known as:  HYDRODIURIL      TAKE these medications        clotrimazole 1 % cream  Commonly known as:  LOTRIMIN  Apply 1 application topically daily as needed (for rash).     multivitamin with minerals Tabs tablet  Take 1 tablet by mouth daily.     psyllium 58.6 % powder  Commonly known as:  METAMUCIL  Take 1 packet by mouth 5 (five) times daily.     sertraline 100 MG tablet  Commonly known as:  ZOLOFT  Take 100 mg by mouth daily.     SYSTANE BALANCE 0.6 % Soln  Generic drug:  Propylene Glycol  Apply 1 drop to eye at bedtime.     traZODone 150 MG tablet  Commonly known as:  DESYREL  Take 75 mg by mouth at bedtime.        Discharge Condition: *stable  Disposition: Final discharge disposition not confirmed   Consults:  neurology  Significant Diagnostic Studies: Ct Angio Head W/cm &/or Wo Cm  07/31/2014   CLINICAL DATA:  Code stroke  EXAM: CT ANGIOGRAPHY HEAD AND NECK  TECHNIQUE: Multidetector CT imaging of the head and neck was performed using the standard protocol during bolus administration of intravenous contrast. Multiplanar CT image reconstructions and MIPs were obtained to evaluate the vascular anatomy. Carotid stenosis measurements (when applicable) are obtained utilizing NASCET criteria, using the distal internal carotid diameter as the denominator.  CONTRAST:   61mL OMNIPAQUE IOHEXOL 350 MG/ML SOLN  COMPARISON:  CT head 07/31/2014  FINDINGS: CTA HEAD FINDINGS  CT head reported separately.  Left vertebral dominant. Both vertebral arteries patent to the basilar. PICA, superior cerebellar and posterior cerebral arteries are widely patent. Right AICA also noted.  Cavernous carotid widely patent bilaterally with minimal atherosclerotic calcification. No significant carotid stenosis. Anterior and middle cerebral arteries are normal bilaterally without stenosis.  Negative for cerebral aneurysm.  Dural sinuses are clear without thrombosis.  Review of the MIP images confirms the above findings.  CTA NECK FINDINGS  Three vessel aortic arch. Aortic arch negative. Proximal great vessels are widely patent.  Right carotid: Right common carotid widely patent. Right carotid bifurcation normal. No significant atherosclerotic disease. Negative for dissection or carotid stenosis  Left carotid: Left common carotid artery is normal. Left carotid bifurcation is normal. No atherosclerotic disease or dissection noted.  Vertebral arteries: Left vertebral artery dominant. Both vertebral arteries are patent to the basilar without stenosis or dissection.  Cervical spondylosis noted diffusely most prominent at C5-6 and C6-7. No acute bony change.  Review of the MIP images confirms the above findings.  IMPRESSION: Negative CTA of the head  Negative CTA of the neck.  Critical Value/emergent results were called by telephone at the time of interpretation on 07/31/2014 at 1:12 pm to Dr. Jim Like , who verbally acknowledged  these results.   Electronically Signed   By: Franchot Gallo M.D.   On: 07/31/2014 13:13   Ct Head Wo Contrast  07/31/2014   CLINICAL DATA:  Sudden onset of confusion.  EXAM: CT HEAD WITHOUT CONTRAST  TECHNIQUE: Contiguous axial images were obtained from the base of the skull through the vertex without intravenous contrast.  COMPARISON:  CT 07/31/2005  FINDINGS: No intra-axial or  extra-axial pathologic fluid or blood collection. No mass lesion. No hydrocephalus. No hemorrhage. No acute bony abnormality. Old nasal fractures. Mild mucosal thickening ethmoid or maxillary sinuses.  IMPRESSION: No acute abnormality.   Electronically Signed   By: Marcello Moores  Register   On: 07/31/2014 12:08   Ct Angio Neck W/cm &/or Wo/cm  07/31/2014   CLINICAL DATA:  Code stroke  EXAM: CT ANGIOGRAPHY HEAD AND NECK  TECHNIQUE: Multidetector CT imaging of the head and neck was performed using the standard protocol during bolus administration of intravenous contrast. Multiplanar CT image reconstructions and MIPs were obtained to evaluate the vascular anatomy. Carotid stenosis measurements (when applicable) are obtained utilizing NASCET criteria, using the distal internal carotid diameter as the denominator.  CONTRAST:  64mL OMNIPAQUE IOHEXOL 350 MG/ML SOLN  COMPARISON:  CT head 07/31/2014  FINDINGS: CTA HEAD FINDINGS  CT head reported separately.  Left vertebral dominant. Both vertebral arteries patent to the basilar. PICA, superior cerebellar and posterior cerebral arteries are widely patent. Right AICA also noted.  Cavernous carotid widely patent bilaterally with minimal atherosclerotic calcification. No significant carotid stenosis. Anterior and middle cerebral arteries are normal bilaterally without stenosis.  Negative for cerebral aneurysm.  Dural sinuses are clear without thrombosis.  Review of the MIP images confirms the above findings.  CTA NECK FINDINGS  Three vessel aortic arch. Aortic arch negative. Proximal great vessels are widely patent.  Right carotid: Right common carotid widely patent. Right carotid bifurcation normal. No significant atherosclerotic disease. Negative for dissection or carotid stenosis  Left carotid: Left common carotid artery is normal. Left carotid bifurcation is normal. No atherosclerotic disease or dissection noted.  Vertebral arteries: Left vertebral artery dominant. Both  vertebral arteries are patent to the basilar without stenosis or dissection.  Cervical spondylosis noted diffusely most prominent at C5-6 and C6-7. No acute bony change.  Review of the MIP images confirms the above findings.  IMPRESSION: Negative CTA of the head  Negative CTA of the neck.  Critical Value/emergent results were called by telephone at the time of interpretation on 07/31/2014 at 1:12 pm to Dr. Collier Salina, Stuart Huynh , who verbally acknowledged these results.   Electronically Signed   By: Franchot Gallo M.D.   On: 07/31/2014 13:13   Mr Brain Wo Contrast  08/01/2014   CLINICAL DATA:  73 year old hypertensive male presenting with confusion. Subsequent encounter.  EXAM: MRI HEAD WITHOUT CONTRAST  TECHNIQUE: Multiplanar, multiecho pulse sequences of the brain and surrounding structures were obtained without intravenous contrast.  COMPARISON:  07/31/2014 CT and CT angiogram.  No comparison MR.  FINDINGS: No acute infarct.  Mild small vessel disease type changes.  No intracranial hemorrhage.  No intracranial mass lesion noted on this unenhanced exam.  Age related mild atrophy minimal asymmetric greater on the right. No hydrocephalus.  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 Major intracranial vascular structures are patent.  Cervical medullary junction, pituitary region, pineal region and orbital structures unremarkable.  Polypoid mucosal thickening maxillary sinuses with minimal to mild mucosal thickening ethmoid sinus air cells.  IMPRESSION: No acute infarct.  Mild small vessel disease  type changes.  Paranasal sinus mucosal thickening as noted above.   Electronically Signed   By: Chauncey Cruel M.D.   On: 08/01/2014 08:38      Microbiology: No results found for this or any previous visit (from the past 240 hour(s)).   Labs: Results for orders placed or performed during the hospital encounter of 07/31/14 (from the past 48 hour(s))  CBG monitoring, ED     Status: Abnormal   Collection Time: 07/31/14  11:24 AM  Result Value Ref Range   Glucose-Capillary 106 (H) 70 - 99 mg/dL  Protime-INR     Status: None   Collection Time: 07/31/14 11:47 AM  Result Value Ref Range   Prothrombin Time 14.2 11.6 - 15.2 seconds   INR 1.09 0.00 - 1.49  APTT     Status: None   Collection Time: 07/31/14 11:47 AM  Result Value Ref Range   aPTT 27 24 - 37 seconds  CBC     Status: None   Collection Time: 07/31/14 11:47 AM  Result Value Ref Range   WBC 6.1 4.0 - 10.5 K/uL   RBC 4.78 4.22 - 5.81 MIL/uL   Hemoglobin 15.3 13.0 - 17.0 g/dL   HCT 43.5 39.0 - 52.0 %   MCV 91.0 78.0 - 100.0 fL   MCH 32.0 26.0 - 34.0 pg   MCHC 35.2 30.0 - 36.0 g/dL   RDW 12.8 11.5 - 15.5 %   Platelets 227 150 - 400 K/uL  Differential     Status: None   Collection Time: 07/31/14 11:47 AM  Result Value Ref Range   Neutrophils Relative % 67 43 - 77 %   Neutro Abs 4.1 1.7 - 7.7 K/uL   Lymphocytes Relative 20 12 - 46 %   Lymphs Abs 1.2 0.7 - 4.0 K/uL   Monocytes Relative 12 3 - 12 %   Monocytes Absolute 0.7 0.1 - 1.0 K/uL   Eosinophils Relative 1 0 - 5 %   Eosinophils Absolute 0.0 0.0 - 0.7 K/uL   Basophils Relative 0 0 - 1 %   Basophils Absolute 0.0 0.0 - 0.1 K/uL  Comprehensive metabolic panel     Status: Abnormal   Collection Time: 07/31/14 11:47 AM  Result Value Ref Range   Sodium 140 137 - 147 mEq/L   Potassium 4.1 3.7 - 5.3 mEq/L   Chloride 104 96 - 112 mEq/L   CO2 25 19 - 32 mEq/L   Glucose, Bld 101 (H) 70 - 99 mg/dL   BUN 16 6 - 23 mg/dL   Creatinine, Ser 1.13 0.50 - 1.35 mg/dL   Calcium 9.5 8.4 - 10.5 mg/dL   Total Protein 6.7 6.0 - 8.3 g/dL   Albumin 3.8 3.5 - 5.2 g/dL   AST 23 0 - 37 U/L   ALT 19 0 - 53 U/L   Alkaline Phosphatase 55 39 - 117 U/L   Total Bilirubin 0.8 0.3 - 1.2 mg/dL   GFR calc non Af Amer 62 (L) >90 mL/min   GFR calc Af Amer 71 (L) >90 mL/min    Comment: (NOTE) The eGFR has been calculated using the CKD EPI equation. This calculation has not been validated in all clinical  situations. eGFR's persistently <90 mL/min signify possible Chronic Kidney Disease.    Anion gap 11 5 - 15  CBG monitoring, ED     Status: Abnormal   Collection Time: 07/31/14 11:59 AM  Result Value Ref Range   Glucose-Capillary 102 (H) 70 - 99  mg/dL  I-stat chem 8, ed     Status: Abnormal   Collection Time: 07/31/14 12:39 PM  Result Value Ref Range   Sodium 140 137 - 147 mEq/L   Potassium 4.1 3.7 - 5.3 mEq/L   Chloride 107 96 - 112 mEq/L   BUN 19 6 - 23 mg/dL   Creatinine, Ser 1.20 0.50 - 1.35 mg/dL   Glucose, Bld 97 70 - 99 mg/dL   Calcium, Ion 1.11 (L) 1.13 - 1.30 mmol/L   TCO2 26 0 - 100 mmol/L   Hemoglobin 15.6 13.0 - 17.0 g/dL   HCT 46.0 39.0 - 52.0 %  I-stat troponin, ED (not at [facility])     Status: None   Collection Time: 07/31/14  2:36 PM  Result Value Ref Range   Troponin i, poc 0.01 0.00 - 0.08 ng/mL   Comment 3            Comment: Due to the release kinetics of cTnI, a negative result within the first hours of the onset of symptoms does not rule out myocardial infarction with certainty. If myocardial infarction is still suspected, repeat the test at appropriate intervals.   Urinalysis, Routine w reflex microscopic     Status: Abnormal   Collection Time: 07/31/14  6:56 PM  Result Value Ref Range   Color, Urine YELLOW YELLOW   APPearance CLEAR CLEAR   Specific Gravity, Urine 1.043 (H) 1.005 - 1.030   pH 5.0 5.0 - 8.0   Glucose, UA NEGATIVE NEGATIVE mg/dL   Hgb urine dipstick TRACE (A) NEGATIVE   Bilirubin Urine NEGATIVE NEGATIVE   Ketones, ur NEGATIVE NEGATIVE mg/dL   Protein, ur NEGATIVE NEGATIVE mg/dL   Urobilinogen, UA 0.2 0.0 - 1.0 mg/dL   Nitrite NEGATIVE NEGATIVE   Leukocytes, UA NEGATIVE NEGATIVE  Urine microscopic-add on     Status: None   Collection Time: 07/31/14  6:56 PM  Result Value Ref Range   WBC, UA 0-2 <3 WBC/hpf   RBC / HPF 0-2 <3 RBC/hpf  TSH     Status: None   Collection Time: 07/31/14  8:19 PM  Result Value Ref Range   TSH 1.240  0.350 - 4.500 uIU/mL  Basic metabolic panel     Status: Abnormal   Collection Time: 08/01/14  3:29 AM  Result Value Ref Range   Sodium 141 137 - 147 mEq/L   Potassium 3.7 3.7 - 5.3 mEq/L   Chloride 105 96 - 112 mEq/L   CO2 25 19 - 32 mEq/L   Glucose, Bld 91 70 - 99 mg/dL   BUN 18 6 - 23 mg/dL   Creatinine, Ser 1.10 0.50 - 1.35 mg/dL   Calcium 8.7 8.4 - 10.5 mg/dL   GFR calc non Af Amer 64 (L) >90 mL/min   GFR calc Af Amer 74 (L) >90 mL/min    Comment: (NOTE) The eGFR has been calculated using the CKD EPI equation. This calculation has not been validated in all clinical situations. eGFR's persistently <90 mL/min signify possible Chronic Kidney Disease.    Anion gap 11 5 - 15  CBC     Status: None   Collection Time: 08/01/14  3:29 AM  Result Value Ref Range   WBC 4.8 4.0 - 10.5 K/uL   RBC 4.27 4.22 - 5.81 MIL/uL   Hemoglobin 13.5 13.0 - 17.0 g/dL   HCT 39.3 39.0 - 52.0 %   MCV 92.0 78.0 - 100.0 fL   MCH 31.6 26.0 - 34.0 pg  MCHC 34.4 30.0 - 36.0 g/dL   RDW 13.1 11.5 - 15.5 %   Platelets 210 150 - 400 K/uL     Stuart Huynh is a 73 y.o. male With a past medical history of hypertension, depression, who was in his usual state health until approximately 10:30 this morning where he was noted by his wife to be asking "strange questions." They had intercourse earlier in the morning. His wife stated that he asked questions about events which they have been planning for the last several months and was unlike him to be asking those queastions. This was accompanied with disorientation and inability to recall recent events or even recognize certain family members in pictures. He asked the same question multiple times to family members. His wife also noticed him being a little slower to respond. During my encounter he cannot recall this mornings events however could tell me what he did yesterday evening for Halloween. He is able to recognize his family members who are present at bedside.  He denies recent head trauma, fevers, chills, headache, nausea, vomiting. Patient had a head CT in the emergency room which was negative. A code stroke had been called for which neurology was consulted. Dr. Janann Huynh recommended MRI and EEG. Patient otherwise having a nonfocal neurologic examination.  HOSPITAL COURSE:    Possible transient global amnesia. Family members reporting that patient had been in his usual state of health until this morning where family noticed him to be disoriented, having retrograde amnesia and repeating the same question multiple times. Workup in the emergency department included a CT scan of brain which was unremarkable. He had a non-focal neurologic examination with the rest of his lab work otherwise unremarkable. Patient was seen and evaluated by neurology who has recommended obtaining MRI of brainit was negative, CT angiogram of the head and the neck was negative and EEGresults are pending. Discussed with Dr. Nicole Kindred neurology, okay to discharge from a standpoint.   History of hypertension. Patient on hydrochlorothiazide therapy at home,noted to have transient intermittent hypotension with systolic blood pressure as low as 107.also noted to have resting bradycardia with heart rate reading from 39-43. I have recommended to discontinue hydrochlorothiazide  for now, he was started on IV fluid hydration overnight.patient recommended to have ambulatory blood pressure monitoring as well as possibly an event monitor to further evaluate his bradycardia as well as his PACs.   Bradycardia. Patient having heart rates in the 40s to 50s in the emergency department. He is not taking nodal agents, EKG reveals sinus rhythm with presence of PAC's.telemetry overnight continued to show PACs with heart rate in the 38-43 range. 60s when the patient is awake. Recommend holding antihypertensive medication at this time and considering an event monitor.2-D echo performed results  pending.  Major depression. Continue Zoloft 100 mg by mouth daily 5. DVT prophylaxis Lovenox  Discharge Exam:  Blood pressure 107/51, pulse 38, temperature 98.2 F (36.8 C), temperature source Oral, resp. rate 18, height $RemoveBe'5\' 11"'[id number]$  (1.803 m), weight 77.747 kg (171 lb 6.4 oz), SpO2 95 %.   General: Appears calm and comfortable  Eyes: PERRL, normal lids, irises & conjunctiva  ENT: grossly normal hearing, lips & tongue  Neck: no LAD, masses or thyromegaly  Cardiovascular: RRR, no m/r/g. No LE edema.  Telemetry: SR, no arrhythmias   Respiratory: CTA bilaterally, no w/r/r. Normal respiratory effort.  Abdomen: soft, ntnd  Skin: no rash or induration seen on limited exam  Musculoskeletal: grossly normal tone BUE/BLE  Psychiatric:  grossly normal mood and affect, speech fluent and appropriate  Neurologic: grossly non-focal.       Discharge Instructions    Diet - low sodium heart healthy    Complete by:  As directed      Increase activity slowly    Complete by:  As directed            Follow-up Information    Follow up with pcp. Schedule an appointment as soon as possible for a visit in 3 days.      SignedReyne Dumas 08/01/2014, 10:58 AM

## 2014-08-01 NOTE — Plan of Care (Signed)
Problem: Phase I Progression Outcomes Goal: Voiding-avoid urinary catheter unless indicated Outcome: Completed/Met Date Met:  08/01/14

## 2014-08-01 NOTE — Plan of Care (Signed)
Problem: Discharge Progression Outcomes Goal: Other Discharge Outcomes/Goals Outcome: Adequate for Discharge

## 2014-08-01 NOTE — Progress Notes (Signed)
  Echocardiogram 2D Echocardiogram has been performed.  Darlina Sicilian M 08/01/2014, 11:41 AM

## 2014-08-01 NOTE — Plan of Care (Signed)
Problem: Discharge Progression Outcomes Goal: Discharge plan in place and appropriate Outcome: Completed/Met Date Met:  08/01/14 Goal: Pain controlled with appropriate interventions Outcome: Completed/Met Date Met:  08/01/14 Goal: Hemodynamically stable Outcome: Completed/Met Date Met:  12/12/92 Goal: Complications resolved/controlled Outcome: Completed/Met Date Met:  08/01/14 Goal: Tolerating diet Outcome: Completed/Met Date Met:  08/01/14 Goal: Activity appropriate for discharge plan Outcome: Completed/Met Date Met:  08/01/14 Goal: Other Discharge Outcomes/Goals Outcome: Adequate for Discharge

## 2014-08-01 NOTE — Progress Notes (Signed)
EEG Completed; Results Pending  

## 2014-08-01 NOTE — Plan of Care (Signed)
Problem: Phase I Progression Outcomes Goal: Pain controlled with appropriate interventions Outcome: Not Applicable Date Met:  65/65/99

## 2014-08-02 NOTE — Procedures (Signed)
ELECTROENCEPHALOGRAM REPORT  Patient: Stuart Huynh       Room #: 6K86 EEG No. ID: [id number] Age: 73 y.o.        Sex: male Referring Physician: Allyson Sabal Report Date:  08/02/2014        Interpreting Physician: Anthony Sar  History: [name] is an 73 y.o. male who experienced an episode of transient global amnesia on 07/31/2014  Indications for study:  Rule out possible focal seizure disorder.  Technique: This is an 18 channel routine scalp EEG performed at the bedside with bipolar and monopolar montages arranged in accordance to the international 10/20 system of electrode placement.   Description: This EEG recording was performed during wakefulness. Predominant background activity consisted of 9-10 Hz symmetrical alpha rhythm. Photic stimulation produced symmetrical occipital driving response. Hyperventilation was not performed. No epileptiform discharges were recorded. There was no abnormal slowing of cerebral activity.  Interpretation: This is a normal EEG recording during wakefulness.   Rush Farmer M.D. [facility] [phone]

## 2014-08-05 ENCOUNTER — Encounter: Payer: Self-pay | Admitting: *Deleted

## 2014-08-08 ENCOUNTER — Institutional Professional Consult (permissible substitution): Payer: Medicare Other | Admitting: Cardiology

## 2014-08-10 ENCOUNTER — Ambulatory Visit
Admission: RE | Admit: 2014-08-10 | Discharge: 2014-08-10 | Disposition: A | Discharge status: Home / Self Care | Payer: Medicare Other | Source: Ambulatory Visit | Attending: Cardiology | Admitting: Cardiology

## 2014-08-10 ENCOUNTER — Other Ambulatory Visit: Payer: Self-pay | Admitting: Cardiology

## 2014-08-10 DIAGNOSIS — I498 Other specified cardiac arrhythmias: Secondary | ICD-10-CM

## 2014-08-12 ENCOUNTER — Other Ambulatory Visit: Payer: Self-pay | Admitting: *Deleted

## 2014-08-12 DIAGNOSIS — I517 Cardiomegaly: Secondary | ICD-10-CM

## 2014-08-16 ENCOUNTER — Encounter: Payer: Self-pay | Admitting: Cardiology

## 2014-08-29 ENCOUNTER — Other Ambulatory Visit: Payer: Self-pay | Admitting: *Deleted

## 2014-08-29 ENCOUNTER — Ambulatory Visit (HOSPITAL_COMMUNITY): Payer: Medicare Other

## 2014-08-29 DIAGNOSIS — Q211 Atrial septal defect, unspecified: Secondary | ICD-10-CM

## 2014-08-30 ENCOUNTER — Encounter: Payer: Self-pay | Admitting: Cardiovascular Disease

## 2014-08-31 ENCOUNTER — Ambulatory Visit (HOSPITAL_COMMUNITY)
Admission: RE | Admit: 2014-08-31 | Discharge: 2014-08-31 | Disposition: A | Discharge status: Home / Self Care | Payer: Medicare Other | Source: Ambulatory Visit | Attending: Cardiovascular Disease | Admitting: Cardiovascular Disease

## 2014-08-31 ENCOUNTER — Ambulatory Visit (HOSPITAL_COMMUNITY): Payer: Medicare Other

## 2014-08-31 DIAGNOSIS — Q211 Atrial septal defect, unspecified: Secondary | ICD-10-CM

## 2014-08-31 MED ORDER — NITROGLYCERIN 0.4 MG SL SUBL
SUBLINGUAL_TABLET | SUBLINGUAL | Status: AC
  Administered 2014-08-31: 0.4 mg
  Filled 2014-08-31: qty 1

## 2014-08-31 MED ORDER — IOHEXOL 350 MG/ML SOLN
100.0000 mL | Freq: Once | INTRAVENOUS | Status: AC | PRN
  Administered 2014-08-31: 80 mL via INTRAVENOUS

## 2015-01-08 ENCOUNTER — Encounter (HOSPITAL_COMMUNITY): Payer: Self-pay | Admitting: *Deleted

## 2015-01-08 ENCOUNTER — Emergency Department (HOSPITAL_COMMUNITY)
Admission: EM | Admit: 2015-01-08 | Discharge: 2015-01-09 | Disposition: A | Discharge status: Home / Self Care | Payer: Medicare PPO | Attending: Emergency Medicine | Admitting: Emergency Medicine

## 2015-01-08 ENCOUNTER — Emergency Department (HOSPITAL_COMMUNITY): Payer: Medicare PPO

## 2015-01-08 DIAGNOSIS — R001 Bradycardia, unspecified: Secondary | ICD-10-CM

## 2015-01-08 DIAGNOSIS — G454 Transient global amnesia: Secondary | ICD-10-CM | POA: Diagnosis not present

## 2015-01-08 DIAGNOSIS — I10 Essential (primary) hypertension: Secondary | ICD-10-CM | POA: Diagnosis not present

## 2015-01-08 DIAGNOSIS — Z8719 Personal history of other diseases of the digestive system: Secondary | ICD-10-CM | POA: Diagnosis not present

## 2015-01-08 DIAGNOSIS — Z79899 Other long term (current) drug therapy: Secondary | ICD-10-CM | POA: Insufficient documentation

## 2015-01-08 DIAGNOSIS — F329 Major depressive disorder, single episode, unspecified: Secondary | ICD-10-CM | POA: Insufficient documentation

## 2015-01-08 DIAGNOSIS — I639 Cerebral infarction, unspecified: Secondary | ICD-10-CM | POA: Diagnosis present

## 2015-01-08 DIAGNOSIS — Z87891 Personal history of nicotine dependence: Secondary | ICD-10-CM | POA: Diagnosis not present

## 2015-01-08 DIAGNOSIS — R404 Transient alteration of awareness: Secondary | ICD-10-CM | POA: Diagnosis not present

## 2015-01-08 LAB — CBC
HCT: 40.7 % (ref 39.0–52.0)
Hemoglobin: 13.9 g/dL (ref 13.0–17.0)
MCH: 31.2 pg (ref 26.0–34.0)
MCHC: 34.2 g/dL (ref 30.0–36.0)
MCV: 91.5 fL (ref 78.0–100.0)
PLATELETS: 205 10*3/uL (ref 150–400)
RBC: 4.45 MIL/uL (ref 4.22–5.81)
RDW: 13.2 % (ref 11.5–15.5)
WBC: 5 10*3/uL (ref 4.0–10.5)

## 2015-01-08 LAB — I-STAT CHEM 8, ED
BUN: 19 mg/dL (ref 6–23)
Calcium, Ion: 1.16 mmol/L (ref 1.13–1.30)
Chloride: 106 mmol/L (ref 96–112)
Creatinine, Ser: 1.2 mg/dL (ref 0.50–1.35)
GLUCOSE: 165 mg/dL — AB (ref 70–99)
HCT: 43 % (ref 39.0–52.0)
HEMOGLOBIN: 14.6 g/dL (ref 13.0–17.0)
Potassium: 3.9 mmol/L (ref 3.5–5.1)
Sodium: 142 mmol/L (ref 135–145)
TCO2: 21 mmol/L (ref 0–100)

## 2015-01-08 LAB — COMPREHENSIVE METABOLIC PANEL
ALT: 47 U/L (ref 0–53)
ANION GAP: 5 (ref 5–15)
AST: 40 U/L — ABNORMAL HIGH (ref 0–37)
Albumin: 3.4 g/dL — ABNORMAL LOW (ref 3.5–5.2)
Alkaline Phosphatase: 56 U/L (ref 39–117)
BUN: 15 mg/dL (ref 6–23)
CO2: 25 mmol/L (ref 19–32)
Calcium: 8.8 mg/dL (ref 8.4–10.5)
Chloride: 108 mmol/L (ref 96–112)
Creatinine, Ser: 1.23 mg/dL (ref 0.50–1.35)
GFR calc Af Amer: 65 mL/min — ABNORMAL LOW (ref >90)
GFR calc non Af Amer: 56 mL/min — ABNORMAL LOW (ref >90)
Glucose, Bld: 168 mg/dL — ABNORMAL HIGH (ref 70–99)
Potassium: 3.8 mmol/L (ref 3.5–5.1)
Sodium: 138 mmol/L (ref 135–145)
Total Bilirubin: 0.7 mg/dL (ref 0.3–1.2)
Total Protein: 5.7 g/dL — ABNORMAL LOW (ref 6.0–8.3)

## 2015-01-08 LAB — I-STAT TROPONIN, ED: TROPONIN I, POC: 0 ng/mL (ref 0.00–0.08)

## 2015-01-08 LAB — DIFFERENTIAL
BASOS ABS: 0 10*3/uL (ref 0.0–0.1)
Basophils Relative: 0 % (ref 0–1)
Eosinophils Absolute: 0 10*3/uL (ref 0.0–0.7)
Eosinophils Relative: 1 % (ref 0–5)
LYMPHS ABS: 1.3 10*3/uL (ref 0.7–4.0)
Lymphocytes Relative: 26 % (ref 12–46)
MONO ABS: 0.4 10*3/uL (ref 0.1–1.0)
MONOS PCT: 8 % (ref 3–12)
NEUTROS ABS: 3.2 10*3/uL (ref 1.7–7.7)
Neutrophils Relative %: 65 % (ref 43–77)

## 2015-01-08 LAB — PROTIME-INR
INR: 1.06 (ref 0.00–1.49)
Prothrombin Time: 14 seconds (ref 11.6–15.2)

## 2015-01-08 LAB — CBG MONITORING, ED: Glucose-Capillary: 164 mg/dL — ABNORMAL HIGH (ref 70–99)

## 2015-01-08 LAB — APTT: APTT: 26 s (ref 24–37)

## 2015-01-08 NOTE — ED Notes (Signed)
Back from MRI at this time

## 2015-01-08 NOTE — ED Notes (Signed)
  CBG 164  

## 2015-01-08 NOTE — ED Provider Notes (Signed)
CSN: [id number]     Arrival date & time 01/08/15  2221 History  This chart was scribed for Delora Fuel, MD by Hilda Lias, ED Scribe. This patient was seen in room D32C/D32C and the patient's care was started at 11:37 PM.    Chief Complaint  Patient presents with  . Code Stroke    An emergency department physician performed an initial assessment on this suspected stroke patient at 2221. The history is provided by the spouse. No language interpreter was used.     HPI Comments: Stuart Huynh is a 74 y.o. male who presents to the Emergency Department complaining of symptoms of a stroke that occurred 2.5 hours PTA. His wife states that they arrived home from a trip a few hours before symptoms began. His wife states that he was standing up and had a blank expression on his face. When she asked what was wrong and questions about what they did the past couple of days he could not remember anything. His wife states that he had the same symptoms during an episode back in October of 2015.     Past Medical History  Diagnosis Date  . Hypertension   . Depression   . Irritable bowel syndrome (IBS)   . 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 Skin cancer excision Right cheek   Family History  Problem Relation Age of Onset  . Multiple myeloma Mother   . Lung cancer Father   . Alcohol abuse Father   . Leukemia Brother   . Deep vein thrombosis Brother    History  Substance Use Topics  . Smoking status: Former Smoker    Types: Cigarettes  . Smokeless tobacco: Never Used  . Alcohol Use: Not on file     Comment: occasional beer    Review of Systems  Psychiatric/Behavioral: Positive for behavioral problems and confusion.  All other systems reviewed and are negative.     Allergies  Sulfa antibiotics  Home Medications   Prior to Admission medications    Medication Sig Start Date End Date Taking? Authorizing Provider  Multiple Vitamin (MULTIVITAMIN WITH MINERALS) TABS tablet Take 1 tablet by mouth daily.   Yes Historical Provider, MD  sertraline (ZOLOFT) 100 MG tablet Take 100 mg by mouth daily.   Yes Historical Provider, MD  traZODone (DESYREL) 150 MG tablet Take 75 mg by mouth at bedtime.   Yes Historical Provider, MD   BP 154/87 mmHg  Pulse 48  Temp(Src) 98.3 F (36.8 C)  Resp 11  SpO2 97% Physical Exam  Constitutional: He is oriented to person, place, and time. He appears well-developed and well-nourished.  HENT:  Head: Normocephalic and atraumatic.  Eyes: EOM are normal. Pupils are equal, round, and reactive to light.  Neck: Normal range of motion. Neck supple. No JVD present.  No carotid bruits   Cardiovascular: Regular rhythm.   Bradycardic  Pulmonary/Chest: Effort normal and breath sounds normal. He has no wheezes. He has no rales. He exhibits no tenderness.  Abdominal: Soft. Bowel sounds are normal. He exhibits no distension and no mass. There is no tenderness.  Musculoskeletal: Normal range of motion. He exhibits no edema.  Lymphadenopathy:    He has no cervical adenopathy.  Neurological: He is alert and oriented to person, place, and time. No cranial nerve deficit. Coordination normal.  Oriented to person and  place but not time. No motor or sensory deficits.  Skin: Skin is warm and dry. No rash noted.  Psychiatric: He has a normal mood and affect. His behavior is normal.  Nursing note and vitals reviewed.   ED Course  Procedures (including critical care time)  DIAGNOSTIC STUDIES: Oxygen Saturation is 97% on room air, normal by my interpretation.    COORDINATION OF CARE: 11:43 PM Discussed treatment plan with pt at bedside and pt agreed to plan.  12:25 AM Pt's wife states he is improving, but is not back to baseline.   1:08 AM Pt's wife states pt is back to baseline and acting normal.   Labs Review Results  for orders placed or performed during the hospital encounter of 01/08/15  Protime-INR  Result Value Ref Range   Prothrombin Time 14.0 11.6 - 15.2 seconds   INR 1.06 0.00 - 1.49  APTT  Result Value Ref Range   aPTT 26 24 - 37 seconds  CBC  Result Value Ref Range   WBC 5.0 4.0 - 10.5 K/uL   RBC 4.45 4.22 - 5.81 MIL/uL   Hemoglobin 13.9 13.0 - 17.0 g/dL   HCT 40.7 39.0 - 52.0 %   MCV 91.5 78.0 - 100.0 fL   MCH 31.2 26.0 - 34.0 pg   MCHC 34.2 30.0 - 36.0 g/dL   RDW 13.2 11.5 - 15.5 %   Platelets 205 150 - 400 K/uL  Differential  Result Value Ref Range   Neutrophils Relative % 65 43 - 77 %   Neutro Abs 3.2 1.7 - 7.7 K/uL   Lymphocytes Relative 26 12 - 46 %   Lymphs Abs 1.3 0.7 - 4.0 K/uL   Monocytes Relative 8 3 - 12 %   Monocytes Absolute 0.4 0.1 - 1.0 K/uL   Eosinophils Relative 1 0 - 5 %   Eosinophils Absolute 0.0 0.0 - 0.7 K/uL   Basophils Relative 0 0 - 1 %   Basophils Absolute 0.0 0.0 - 0.1 K/uL  Comprehensive metabolic panel  Result Value Ref Range   Sodium 138 135 - 145 mmol/L   Potassium 3.8 3.5 - 5.1 mmol/L   Chloride 108 96 - 112 mmol/L   CO2 25 19 - 32 mmol/L   Glucose, Bld 168 (H) 70 - 99 mg/dL   BUN 15 6 - 23 mg/dL   Creatinine, Ser 1.23 0.50 - 1.35 mg/dL   Calcium 8.8 8.4 - 10.5 mg/dL   Total Protein 5.7 (L) 6.0 - 8.3 g/dL   Albumin 3.4 (L) 3.5 - 5.2 g/dL   AST 40 (H) 0 - 37 U/L   ALT 47 0 - 53 U/L   Alkaline Phosphatase 56 39 - 117 U/L   Total Bilirubin 0.7 0.3 - 1.2 mg/dL   GFR calc non Af Amer 56 (L) >90 mL/min   GFR calc Af Amer 65 (L) >90 mL/min   Anion gap 5 5 - 15  CBG monitoring, ED  Result Value Ref Range   Glucose-Capillary 164 (H) 70 - 99 mg/dL  I-Stat Chem 8, ED  Result Value Ref Range   Sodium 142 135 - 145 mmol/L   Potassium 3.9 3.5 - 5.1 mmol/L   Chloride 106 96 - 112 mmol/L   BUN 19 6 - 23 mg/dL   Creatinine, Ser 1.20 0.50 - 1.35 mg/dL   Glucose, Bld 165 (H) 70 - 99 mg/dL   Calcium, Ion 1.16 1.13 - 1.30 mmol/L   TCO2 21 0 - 100  mmol/L  Hemoglobin 14.6 13.0 - 17.0 g/dL   HCT 43.0 39.0 - 52.0 %  I-stat troponin, ED (not at [facility])  Result Value Ref Range   Troponin i, poc 0.00 0.00 - 0.08 ng/mL   Comment 3           Imaging Review Ct Head (brain) Wo Contrast  01/08/2015   CLINICAL DATA:  Code stroke.  Confusion.  EXAM: CT HEAD WITHOUT CONTRAST  TECHNIQUE: Contiguous axial images were obtained from the base of the skull through the vertex without intravenous contrast.  COMPARISON:  07/31/2014  FINDINGS: Skull and Sinuses:Negative for fracture or destructive process. The mastoids, middle ears, and imaged paranasal sinuses are clear.  Orbits: No acute abnormality.  Brain: No evidence of acute infarction, hemorrhage, hydrocephalus, or mass lesion/mass effect. Stable slightly asymmetric increased extra-axial space around the right cerebral hemisphere.  Page placed on 01/08/2015  at 10:52 pm to Dr. Janann Colonel.  IMPRESSION: No acute intracranial disease.   Electronically Signed   By: Monte Fantasia M.D.   On: 01/08/2015 22:54   Mr Brain Wo Contrast  01/08/2015   CLINICAL DATA:  Altered mental status, verbal repetition beginning at 2145 hours. History of hypertension.  EXAM: MRI HEAD WITHOUT CONTRAST  TECHNIQUE: Multiplanar, multiecho pulse sequences of the brain and surrounding structures were obtained without intravenous contrast.  COMPARISON:  CT of the head January 08, 2015 at 2248 hours and MRI of the brain August 01, 2014  FINDINGS: The ventricles and sulci are normal for patient's age. No abnormal parenchymal signal, mass lesions, mass effect. No reduced diffusion to suggest acute ischemia. No susceptibility artifact to suggest hemorrhage. Stable tiny focus of LEFT mesial temporal lobe susceptibility artifact. Mild patchy supratentorial white matter FLAIR T2 hyperintensities are less than expected for age.  No abnormal extra-axial fluid collections. No extra-axial masses though, contrast enhanced sequences would be more sensitive.  Normal major intracranial vascular flow voids seen at the skull base. Sub cm low signal along the anterior falx may reflect dural calcification or tiny meningioma without mass effect.  Ocular globes and orbital contents are unremarkable though not tailored for evaluation. No abnormal sellar expansion. Mild paranasal sinus mucosal thickening without air-fluid levels. The mastoid air cells are well aerated. No suspicious calvarial bone marrow signal. No abnormal sellar expansion. Craniocervical junction maintained.  IMPRESSION: No acute intracranial process, specifically no acute ischemia.  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   Electronically Signed   By: Elon Alas   On: 01/08/2015 23:56     EKG Interpretation   Date/Time:  Sunday January 08 2015 22:51:06 EDT Ventricular Rate:  46 PR Interval:  151 QRS Duration: 111 QT Interval:  465 QTC Calculation: 407 R Axis:   53 Text Interpretation:  Sinus bradycardia Consider left atrial enlargement  Baseline wander in lead(s) I III aVL When compared with ECG of 08/01/2014,  HEART RATE has increased Confirmed by [facility]  MD, Afshin Chrystal ([id number]) on  01/08/2015 11:25:33 PM      MDM   Final diagnoses:  Transient global amnesia  Sinus bradycardia    Transient global amnesia. Old records are reviewed and he had been hospitalized for evaluation of same in November 2015. Wife stated that her previous episode resolved in about 6 hours. CT and diffusion weighted MRI showed no evidence of new stroke. He was observed in the ED and return to baseline by 1 AM. Case was discussed with neurologist who recommended  outpatient ambulatory EEG. Remainder of workup had been done within the last 6 months and does not need to be repeated.  I personally performed the services described in this documentation, which was scribed in my presence. The recorded information has been reviewed and is accurate.     Delora Fuel, MD 32/76/14 [id number]

## 2015-01-08 NOTE — Code Documentation (Signed)
Stuart Huynh presented to the [facility] this evening by pvt after his wife found him confused.  Both he & his wife flew home tonight from [place], arriving at [place] at 1915, and came directly home.  He called in the order for their dinner & left to go get at 2045.  When he returned he was disoriented and unable to remember what he was doing or the events from earlier in the day.  He had a similar incident in October & had a full stroke workup.  On arrival to triage he was still confused & unable to identify the month.  NIH 1.  He was able to recall the name of the president, what city he lived in, & what his wife's name was.  Plan MRI for further evaluation.

## 2015-01-08 NOTE — Consult Note (Signed)
Stroke Consult    Chief Complaint: confusion  HPI: Stuart Huynh is an 74 y.o. male hx of HTN presenting with acute onset of confusion with inability to form new memories along with unable to recall what happened the last few days. Patient flew back from [place] today, was in normal state of health at 2045. Went to get food, when he returned wife noted he was disoriented and did not recall taking a trip. Upon further questioning, unable to recall taking a trip.   CT head imaging reviewed, is unremarkable. Had similar episode in October, had unremarkable EEG and brain MRI at that time.   Date last known well: 01/08/2015 Time last known well: 2045 tPA Given: No, too mild to treat, NIHSS of 1. DWI negative MRI  Past Medical History  Diagnosis Date  . Hypertension   . Depression   . Irritable bowel syndrome (IBS)   . 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 Skin cancer excision Right cheek    Family History  Problem Relation Age of Onset  . Multiple myeloma Mother   . Lung cancer Father   . Alcohol abuse Father   . Leukemia Brother   . Deep vein thrombosis Brother    Social History:  reports that he has quit smoking. His smoking use included Cigarettes. He has never used smokeless tobacco. He reports that he does not use illicit drugs. His alcohol history is not on file.  Allergies:  Allergies  Allergen Reactions  . Sulfa Antibiotics Rash     (Not in a hospital admission)  ROS: Out of a complete 14 system review, the patient complains of only the following symptoms, and all other reviewed systems are negative. +confusion   Physical Examination: Filed Vitals:   01/08/15 2252  BP:   Pulse:   Temp: 98.3 F (36.8 C)  Resp:    Physical Exam  Constitutional: He appears well-developed and well-nourished.  Psych: Affect appropriate to  situation Eyes: No scleral injection HENT: No OP obstrucion Head: Normocephalic.  Cardiovascular: Normal rate and regular rhythm.  Respiratory: Effort normal and breath sounds normal.  GI: Soft. Bowel sounds are normal. No distension. There is no tenderness.  Skin: WDI   Neurologic Examination: Mental Status: Alert, oriented to name, "Hospital", unsure on date. Difficulty telling me his address, unable to report what he has done today.  Immediate recall 3/3, delayed recall 1/3. Speech fluent without evidence of aphasia.  No dysarthria. Able to follow 3 step commands without difficulty. Cranial Nerves: II: funduscopic exam wnl bilaterally, visual fields grossly normal, pupils equal, round, reactive to light and accommodation III,IV, VI: ptosis not present, extra-ocular motions intact bilaterally V,VII: smile symmetric, facial light touch sensation normal bilaterally VIII: hearing normal bilaterally IX,X: gag reflex present XI: trapezius strength/neck flexion strength normal bilaterally XII: tongue strength normal  Motor: Right : Upper extremity    Left:     Upper extremity 5/5 deltoid       5/5 deltoid 5/5 biceps      5/5 biceps  5/5 triceps      5/5 triceps 5/5 hand grip      5/5 hand grip  Lower extremity     Lower extremity 5/5 hip flexor      5/5 hip flexor 5/5 quadricep      5/5 quadriceps  5/5 hamstrings  5/5 hamstrings 5/5 plantar flexion       5/5 plantar flexion 5/5 plantar extension     5/5 plantar extension Tone and bulk:normal tone throughout; no atrophy noted Sensory: Pinprick and light touch intact throughout, bilaterally Deep Tendon Reflexes: 2+ and symmetric throughout Plantars: Right: downgoing   Left: downgoing Cerebellar: normal finger-to-nose, and normal heel-to-shin test Gait: deferred  Laboratory Studies:   Basic Metabolic Panel: No results for input(s): NA, K, CL, CO2, GLUCOSE, BUN, CREATININE, CALCIUM, MG, PHOS in the last 168 hours.  Liver  Function Tests: No results for input(s): AST, ALT, ALKPHOS, BILITOT, PROT, ALBUMIN in the last 168 hours. No results for input(s): LIPASE, AMYLASE in the last 168 hours. No results for input(s): AMMONIA in the last 168 hours.  CBC: No results for input(s): WBC, NEUTROABS, HGB, HCT, MCV, PLT in the last 168 hours.  Cardiac Enzymes: No results for input(s): CKTOTAL, CKMB, CKMBINDEX, TROPONINI in the last 168 hours.  BNP: Invalid input(s): POCBNP  CBG:  Recent Labs Lab 01/08/15 2249  GLUCAP 164*    Microbiology: No results found for this or any previous visit.  Coagulation Studies: No results for input(s): LABPROT, INR in the last 72 hours.  Urinalysis: No results for input(s): COLORURINE, LABSPEC, PHURINE, GLUCOSEU, HGBUR, BILIRUBINUR, KETONESUR, PROTEINUR, UROBILINOGEN, NITRITE, LEUKOCYTESUR in the last 168 hours.  Invalid input(s): APPERANCEUR  Lipid Panel:  No results found for: CHOL, TRIG, HDL, CHOLHDL, VLDL, LDLCALC  HgbA1C: No results found for: HGBA1C  Urine Drug Screen:  No results found for: LABOPIA, COCAINSCRNUR, LABBENZ, AMPHETMU, THCU, LABBARB  Alcohol Level: No results for input(s): ETH in the last 168 hours.  Other results:  Imaging: Ct Head (brain) Wo Contrast  01/08/2015   CLINICAL DATA:  Code stroke.  Confusion.  EXAM: CT HEAD WITHOUT CONTRAST  TECHNIQUE: Contiguous axial images were obtained from the base of the skull through the vertex without intravenous contrast.  COMPARISON:  07/31/2014  FINDINGS: Skull and Sinuses:Negative for fracture or destructive process. The mastoids, middle ears, and imaged paranasal sinuses are clear.  Orbits: No acute abnormality.  Brain: No evidence of acute infarction, hemorrhage, hydrocephalus, or mass lesion/mass effect. Stable slightly asymmetric increased extra-axial space around the right cerebral hemisphere.  Page placed on 01/08/2015  at 10:52 pm to Dr. Janann Colonel.  IMPRESSION: No acute intracranial disease.    Electronically Signed   By: Monte Fantasia M.D.   On: 01/08/2015 22:54    Assessment: 74 y.o. male with history of hypertension presenting with acute onset of confusion. Difficulty forming new memories and difficulty with recall of events from the past few days. MRI brain DWI negative for acute stroke. Unclear etiology, question possible transient global amnesia. Had similar episode in October with negative MRI brain and EEG.   -would observe in ED, if mental status improving can discharge home with outpatient neurology follow up -may benefit from prolonged ambulatory EEG as outpatient -would consider cardiology follow up for possible cardiac etiology  Jim Like, DO [facility] [phone]  If 7pm- 7am, please page neurology on call as listed in [place]. 01/08/2015, 11:02 PM

## 2015-01-08 NOTE — ED Notes (Signed)
The pt just arrived back in town from [place] .  His wife had fixed dinner and suddenly he was standing in the middle of the room not reacting verbally approx 2145 last time seen normal.   He has been asking the same questions over and over again since that time.  No pain anywhere no speech .Marland Kitchen Similar episode approx one year ago

## 2015-01-08 NOTE — ED Notes (Signed)
In MRI with patient at this time.

## 2015-01-09 NOTE — Discharge Instructions (Signed)
Talk with your primary care provider about possibly ordering an ambulatory EEG.

## 2015-01-16 ENCOUNTER — Encounter: Payer: Self-pay | Admitting: Neurology

## 2015-01-16 ENCOUNTER — Ambulatory Visit (INDEPENDENT_AMBULATORY_CARE_PROVIDER_SITE_OTHER): Payer: Medicare PPO | Admitting: Neurology

## 2015-01-16 VITALS — BP 118/78 | HR 57 | Resp 16 | Ht 71.0 in | Wt 178.0 lb

## 2015-01-16 DIAGNOSIS — G454 Transient global amnesia: Secondary | ICD-10-CM | POA: Diagnosis not present

## 2015-01-16 NOTE — Progress Notes (Signed)
Subjective:    Patient ID: Stuart Huynh is a 74 y.o. male.  HPI     Star Age, MD, PhD [facility] [address], [address] [place], [place] [address]  Dear Dr. Addison Lank,   I saw your patient, Stuart Huynh, upon your kind request in my neurologic clinic today for initial consultation of his history of transient global amnesia with recurrent episodes. The patient is accompanied by his wife today. As you know, Stuart Huynh is a 74 year old right-handed gentleman with an underlying medical history of anxiety, insomnia, hypertension, mild obstructive sleep apnea, lumbar disc disease, who was recently seen in the emergency room on 01/08/2015 with a episode of staring spell and a prior diagnosis of transient global amnesia. I reviewed his emergency room records from 01/08/2015 as well as prior hospitalization records from his admission from 07/31/2014 through 08/01/2014. At that time he was brought in by his family to the emergency room because of disorientation and difficulty recognizing even family members. He was not able to recall what he had done in the morning and the day before. Head CT in the emergency room was negative for an acute changes. I personally reviewed the noncontrast head CT from 07/31/2014 which per my review did not show any acute changes. Neurological consult was obtained and an MRI brain and EEG were recommended. He was admitted to the hospital. EEG was normal in the awake state. CT angiography head and neck was negative. I reviewed his brain MRI without contrast from 08/01/2014: No acute infarct. Mild small vessel disease type changes. Paranasal sinus mucosal thickening as noted above. In addition, personally reviewed the images through the PACS system. 2-D echocardiogram on 08/01/2014 showed EF of 55-60%.  On 01/08/2015 he had a noncontrast head CT in the Emergency room. I personally reviewed the images through the PACS system.  He was observed in  the emergency room and returned back to baseline, after about 4 hours.   While he feels at baseline, he has had some difficulty with motivation and feels "vulnerable". He has been on Zoloft for years and Trazodone 150 mg for sleep for 8-10 years.   His wife provides additional information.   He saw cardiology with w/u including 30 day holter, which per wife was negative.   His Past Medical History Is Significant For: Past Medical History  Diagnosis Date  . Hypertension   . Depression   . Irritable bowel syndrome (IBS)   . Hernia, hiatal     herniated disc in spine, produces numbness in left ankle has had for years per patient  . Transient global amnesia   . Lumbar herniated disc   . BPH (benign prostatic hyperplasia)   . Anxiety   . Insomnia   . Skin cancer     His Past Surgical History Is Significant For: Past Surgical History  Procedure Laterality Date  . Skin cancer excision Right cheek  . Transurethral resection of prostate    . Cystectomy      His Family History Is Significant For: Family History  Problem Relation Age of Onset  . Multiple myeloma Mother   . Lung cancer Father   . Alcohol abuse Father   . Leukemia Brother   . Deep vein thrombosis Brother     His Social History Is Significant For: History   Social History  . Marital Status: Married    Spouse Name: N/A  . Number of Children: 4  . Years of Education: PhD  Occupational History  . Retired    Social History Main Topics  . Smoking status: Former Smoker    Types: Cigarettes  . Smokeless tobacco: Never Used     Comment: Quit in 1968  . Alcohol Use: 0.0 oz/week    0 Standard drinks or equivalent per week     Comment: occasional beer  . Drug Use: No  . Sexual Activity: Not on file     Comment: quit smoking about 50 years ago   Other Topics Concern  . None   Social History Narrative   2 cups of coffee a week     His Allergies Are:  Allergies  Allergen Reactions  . Sulfa Antibiotics  Rash  :   His Current Medications Are:  Outpatient Encounter Prescriptions as of 01/16/2015  Medication Sig  . ferrous fumarate (HEMOCYTE - 106 MG FE) 325 (106 FE) MG TABS tablet Take 1 tablet by mouth.  . Multiple Vitamin (MULTIVITAMIN WITH MINERALS) TABS tablet Take 1 tablet by mouth daily.  . psyllium (METAMUCIL) 58.6 % powder Take 1 packet by mouth 4 (four) times daily as needed.  . sertraline (ZOLOFT) 100 MG tablet Take 100 mg by mouth daily.  . traZODone (DESYREL) 150 MG tablet Take 75 mg by mouth at bedtime.  :   Review of Systems:  Out of a complete 14 point review of systems, all are reviewed and negative with the exception of these symptoms as listed below:   Review of Systems  Constitutional: Positive for fatigue.  HENT: Positive for rhinorrhea.   Respiratory:       Snoring   Genitourinary: Positive for dysuria.       Impotence   Allergic/Immunologic: Positive for environmental allergies.  Neurological: Positive for headaches.       Memory loss, Confusion, Dizziness, Insomnia, Sleepiness, Snoring   Psychiatric/Behavioral:       Depression, Anxiety, Not enough sleep, Decreased energy, Disinterest in activities     Objective:  Neurologic Exam  Physical Exam Physical Examination:   Filed Vitals:   01/16/15 1414  BP: 118/78  Pulse: 57  Resp: 16    General Examination: The patient is a very pleasant 74 y.o. male in no acute distress. He appears well-developed and well-nourished and well groomed.   HEENT: Normocephalic, atraumatic, pupils are equal, round and reactive to light and accommodation. Funduscopic exam is normal with sharp disc margins noted. Extraocular tracking is good without limitation to gaze excursion or nystagmus noted. Normal smooth pursuit is noted. Hearing is grossly intact. Tympanic membranes are clear bilaterally. Face is symmetric with normal facial animation and normal facial sensation. Speech is clear with no dysarthria noted. There is no  hypophonia. There is no lip, neck/head, jaw or voice tremor. Neck is supple with full range of passive and active motion. There are no carotid bruits on auscultation. Oropharynx exam reveals: mild to moderate mouth dryness, adequate dental hygiene and mild airway crowding. Mallampati is class II. Tongue protrudes centrally and palate elevates symmetrically.    Chest: Clear to auscultation without wheezing, rhonchi or crackles noted.  Heart: S1+S2+0, regular and normal without murmurs, rubs or gallops noted.   Abdomen: Soft, non-tender and non-distended with normal bowel sounds appreciated on auscultation.  Extremities: There is no pitting edema in the distal lower extremities bilaterally. Pedal pulses are intact.  Skin: Warm and dry without trophic changes noted. There are no varicose veins.  Musculoskeletal: exam reveals no obvious joint deformities, tenderness or joint swelling or erythema.  Neurologically:  Mental status: The patient is awake, alert and oriented in all 4 spheres. His immediate and remote memory, attention, language skills and fund of knowledge are appropriate. There is no evidence of aphasia, agnosia, apraxia or anomia. Speech is clear with normal prosody and enunciation. Thought process is linear. Mood is anxious and constricted and affect is blunted.  Cranial nerves II - XII are as described above under HEENT exam. In addition: shoulder shrug is normal with equal shoulder height noted. Motor exam: Normal bulk, strength and tone is noted. There is no drift, tremor or rebound. Romberg is negative. Reflexes are 2+ throughout. Babinski: Toes are flexor bilaterally. Fine motor skills and coordination: intact with normal finger taps, normal hand movements, normal rapid alternating patting, normal foot taps and normal foot agility.  Cerebellar testing: No dysmetria or intention tremor on finger to nose testing. Heel to shin is unremarkable bilaterally. There is no truncal or gait  ataxia.  Sensory exam: intact to light touch, pinprick, vibration, temperature sense in the upper and lower extremities.  Gait, station and balance: He stands easily. No veering to one side is noted. No leaning to one side is noted. Posture is age-appropriate and stance is narrow based. Gait shows normal stride length and normal pace. No problems turning are noted. He turns en bloc. Tandem walk is difficult for him.   Assessment and Plan:   In summary, Stuart Huynh is a very pleasant 74 y.o.-year old male with an underlying medical history of anxiety, insomnia, hypertension, mild obstructive sleep apnea, lumbar disc disease, with a history of transient alteration on awareness. His history and exam are in keeping with TGA/transient global amnesia. His exam and neurological exam are non-focal.  I had a long chat with the patient and his wife about my findings and the diagnosis of TGA, the prognosis and treatment options. We talked about medical treatments and non-pharmacological approaches. There is no clear etiology for this and no specific treatment. We talked about maintaining a healthy lifestyle in general. I encouraged the patient to eat healthy, exercise daily and keep well hydrated, to keep a scheduled bedtime and wake time routine, to not skip any meals and eat healthy snacks in between meals and to have protein with every meal.   As far as further diagnostic testing is concerned, I suggested the following today: EEG and we will consider repeating the brain MRI down the road. I have advised him not to drive for 6 months for safety. While this is not an epileptic seizure disorder we cannot exclude that he may have an episode while driving and it may not be safe for him to drive until we can be more sure that he is not having recurrent episodes. As far as medications are concerned, I recommended the following at this time: no change.  I answered all their questions today and the patient and his wife  were in agreement with the above outlined plan. I would like to see the patient back in 3 months, sooner if the need arises and encouraged them to call with any interim questions, concerns, problems or updates and we will call with his EEG test results.  Thank you very much for allowing me to participate in the care of this nice patient. If I can be of any further assistance to you please do not hesitate to call me at [phone].  Sincerely,   Star Age, MD, PhD

## 2015-01-16 NOTE — Patient Instructions (Addendum)
You may have had more than one episode of TGA: transient global amnesia, during which patients can have some loss of time, typically no stroke like presentation and patients typically continue to talk and move, but have no recollection of the period, which can last for minutes or hours. We don't quite understand the etiology or significance of TGA as yet. Some reports indicate a migraine-like etiology, some others mention a seizure-like presentation or even stroke-like origin, but in essence, the clear origin of this remains unclear and there is no specific treatment known.  We will do another EEG.   Please do not drive for 6 months.   Please drink more water.   Please call 911 or go to the ER if you have sudden onset of staring, confusion, altered personality or one sided weakness, or numbness or slurring of speech.

## 2015-01-23 ENCOUNTER — Ambulatory Visit (INDEPENDENT_AMBULATORY_CARE_PROVIDER_SITE_OTHER): Payer: Medicare PPO

## 2015-01-23 DIAGNOSIS — G454 Transient global amnesia: Secondary | ICD-10-CM

## 2015-01-23 DIAGNOSIS — R404 Transient alteration of awareness: Secondary | ICD-10-CM

## 2015-01-23 NOTE — Procedures (Signed)
     History: Stuart Huynh is a 74 year old gentleman with recurring episodes of staring and episodes of confusion. The patient recently was seen in emergency room on 01/08/2015 with a confusional event. The patient has been told he has transient global amnesia, he is sent for further evaluation.  This is a routine EEG. No skull defects are noted. Medications include ferrous fumarate, multivitamins, Zoloft, and trazodone.  EEG classification: Dysrhythmia grade 1 left frontotemporal  Description of the recording: The background rhythms of this recording consists of a relatively well-modulated medium amplitude alpha rhythm of 8 Hz that is reactive to eye opening and closure. As the record progresses, photic stimulation is performed, this results in a minimal but bilateral photic driving response. Hyperventilation was not performed. As the record continues, intermittent slightly higher amplitude theta frequency bursts are seen emanating from the left frontotemporal area. These are unassociated with spike or spike-wave discharges or sharp transients. EKG monitor shows an irregular heart rhythm with a rate of 66.  Impression: This is an abnormal EEG recording secondary to very mild intermittent theta slowing emanating from the left frontotemporal area. This study suggests a left brain abnormality, but does not clearly reveal epileptiform discharges. Clinical correlation is required. The EKG monitor throughout the recording shows a very irregular heart rhythm.

## 2015-01-23 NOTE — Progress Notes (Signed)
Quick Note:  Pls call patient and advise him, that I would like to schedule a brief FU to discuss current test results, including EEG and where to go to from here. No cause for alarm. No seizure activity seen on EEG, but would like to discuss further in the context of all test results.  15 min FU should suffice.  Star Age, MD, PhD [facility] ([facility]  ______

## 2015-01-24 ENCOUNTER — Telehealth: Payer: Self-pay

## 2015-01-24 NOTE — Telephone Encounter (Signed)
Spoke with patient and he is aware of message below. He made appt for 5/10.

## 2015-01-24 NOTE — Telephone Encounter (Signed)
-----   Message from Star Age, MD sent at 01/23/2015  1:09 PM EDT ----- Pls call patient and advise him, that I would like to schedule a brief FU to discuss current test results, including EEG and where to go to from here. No cause for alarm. No seizure activity seen on EEG, but would like to discuss further in the context of all test results.  15 min FU should suffice.  Star Age, MD, PhD [facility])

## 2015-02-07 ENCOUNTER — Encounter: Payer: Self-pay | Admitting: Neurology

## 2015-02-07 ENCOUNTER — Ambulatory Visit (INDEPENDENT_AMBULATORY_CARE_PROVIDER_SITE_OTHER): Payer: Medicare PPO | Admitting: Neurology

## 2015-02-07 VITALS — BP 149/76 | HR 48 | Resp 16 | Ht 71.0 in | Wt 180.0 lb

## 2015-02-07 DIAGNOSIS — R930 Abnormal findings on diagnostic imaging of skull and head, not elsewhere classified: Secondary | ICD-10-CM | POA: Diagnosis not present

## 2015-02-07 DIAGNOSIS — G454 Transient global amnesia: Secondary | ICD-10-CM | POA: Diagnosis not present

## 2015-02-07 DIAGNOSIS — R9089 Other abnormal findings on diagnostic imaging of central nervous system: Secondary | ICD-10-CM

## 2015-02-07 DIAGNOSIS — R9401 Abnormal electroencephalogram [EEG]: Secondary | ICD-10-CM

## 2015-02-07 DIAGNOSIS — R404 Transient alteration of awareness: Secondary | ICD-10-CM

## 2015-02-07 NOTE — Patient Instructions (Addendum)
We will do a brain MRI with contrast.   We will also want to repeat your EEG down the road.   I will see you back in August.

## 2015-02-07 NOTE — Progress Notes (Signed)
Subjective:    Patient ID: Stuart Huynh is a 74 y.o. male.  HPI     Interim history:   Stuart Huynh is a 74 year old right-handed gentleman with an underlying medical history of anxiety, insomnia, hypertension, mild obstructive sleep apnea, lumbar disc disease, who presents for follow-up consultation of his history of transient global amnesia. The patient is accompanied by his wife again today. I first met him on 01/16/2015 at the request of his primary care physician, at which time the patient reported a diagnosis of transient global amnesia. He was seen in the emergency room on 01/08/2015. I suggested another EEG and consider another MRI but did not order an MRI at the time. He was advised not to drive until we have evidence that he does not have any spells for at least 6 months. He had an EEG on 01/23/2015: This is an abnormal EEG recording secondary to very mild intermittent theta slowing emanating from the left frontotemporal area. This study suggests a left brain abnormality, but does not clearly reveal epileptiform discharges. Clinical correlation is required. The EKG monitor throughout the recording shows a very irregular heart rhythm.  Today, 02/07/2015: He reports, feeling stable. He had brain MRI without contrast on 01/08/2015:No acute intracranial process, specifically no acute ischemia. 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 I personally reviewed the images through the PACS system. He has had no spells of altered consciousness or confusion and no changes in his medical history medications. He has been trying to drink more water.    Previously:  I reviewed his emergency room records from 01/08/2015 as well as prior hospitalization records from his admission from 07/31/2014 through 08/01/2014. At that time he was brought in by his family to the emergency room because of disorientation and difficulty recognizing even family members.  He was not able to recall what he had done in the morning and the day before. Head CT in the emergency room was negative for an acute changes. I personally reviewed the noncontrast head CT from 07/31/2014 which per my review did not show any acute changes. Neurological consult was obtained and an MRI brain and EEG were recommended. He was admitted to the hospital. EEG was normal in the awake state. CT angiography head and neck was negative. I reviewed his brain MRI without contrast from 08/01/2014: No acute infarct. Mild small vessel disease type changes. Paranasal sinus mucosal thickening as noted above. In addition, personally reviewed the images through the PACS system. 2-D echocardiogram on 08/01/2014 showed EF of 55-60%.  On 01/08/2015 he had a noncontrast head CT in the Emergency room. I personally reviewed the images through the PACS system.   He was observed in the emergency room and returned back to baseline, after about 4 hours.   While he feels at baseline, he has had some difficulty with motivation and feels "vulnerable". He has been on Zoloft for years and Trazodone 150 mg for sleep for 8-10 years.   His wife provides additional information.   He saw cardiology with w/u including 30 day holter, which per wife was negative.    His Past Medical History Is Significant For: Past Medical History  Diagnosis Date  . Hypertension   . Depression   . Irritable bowel syndrome (IBS)   . Hernia, hiatal     herniated disc in spine, produces numbness in left ankle has had for years per patient  . Transient global amnesia   .  Lumbar herniated disc   . BPH (benign prostatic hyperplasia)   . Anxiety   . Insomnia   . Skin cancer     His Past Surgical History Is Significant For: Past Surgical History  Procedure Laterality Date  . Skin cancer excision Right cheek  . Transurethral resection of prostate    . Cystectomy      His Family History Is Significant For: Family History  Problem  Relation Age of Onset  . Multiple myeloma Mother   . Lung cancer Father   . Alcohol abuse Father   . Leukemia Brother   . Deep vein thrombosis Brother     His Social History Is Significant For: History   Social History  . Marital Status: Married    Spouse Name: N/A  . Number of Children: 4  . Years of Education: PhD   Occupational History  . Retired    Social History Main Topics  . Smoking status: Former Smoker    Types: Cigarettes  . Smokeless tobacco: Never Used     Comment: Quit in 1968  . Alcohol Use: 0.0 oz/week    0 Standard drinks or equivalent per week     Comment: occasional beer  . Drug Use: No  . Sexual Activity: Not on file     Comment: quit smoking about 50 years ago   Other Topics Concern  . None   Social History Narrative   2 cups of coffee a week     His Allergies Are:  Allergies  Allergen Reactions  . Sulfa Antibiotics Rash  :   His Current Medications Are:  Outpatient Encounter Prescriptions as of 02/07/2015  Medication Sig  . ferrous fumarate (HEMOCYTE - 106 MG FE) 325 (106 FE) MG TABS tablet Take 1 tablet by mouth.  . Multiple Vitamin (MULTIVITAMIN WITH MINERALS) TABS tablet Take 1 tablet by mouth daily.  . psyllium (METAMUCIL) 58.6 % powder Take 1 packet by mouth 4 (four) times daily as needed.  . sertraline (ZOLOFT) 100 MG tablet Take 100 mg by mouth daily.  . traZODone (DESYREL) 150 MG tablet Take 75 mg by mouth at bedtime.   No facility-administered encounter medications on file as of 02/07/2015.  :  Review of Systems:  Out of a complete 14 point review of systems, all are reviewed and negative with the exception of these symptoms as listed below:   Review of Systems  All other systems reviewed and are negative.   Objective:  Neurologic Exam  Physical Exam Physical Examination:   Filed Vitals:   02/07/15 0837  BP: 149/76  Pulse: 48  Resp: 16   General Examination: The patient is a very pleasant 74 y.o. male in no acute  distress. He appears well-developed and well-nourished and well groomed. He is less talkative today.  HEENT: Normocephalic, atraumatic, pupils are equal, round and reactive to light and accommodation. Funduscopic exam is normal with sharp disc margins noted. Extraocular tracking is good without limitation to gaze excursion or nystagmus noted. Normal smooth pursuit is noted. Hearing is grossly intact. Tympanic membranes are clear bilaterally. Face is symmetric with normal facial animation and normal facial sensation. Speech is clear with no dysarthria noted. There is no hypophonia. There is no lip, neck/head, jaw or voice tremor. Neck is supple with full range of passive and active motion. There are no carotid bruits on auscultation. Oropharynx exam reveals: mild to moderate mouth dryness, adequate dental hygiene and mild airway crowding. Mallampati is class II. Tongue protrudes centrally  and palate elevates symmetrically.    Chest: Clear to auscultation without wheezing, rhonchi or crackles noted.  Heart: S1+S2+0, regular and normal without murmurs, rubs or gallops noted.   Abdomen: Soft, non-tender and non-distended with normal bowel sounds appreciated on auscultation.  Extremities: There is trace pitting edema in the distal lower extremities bilaterally. Pedal pulses are intact.  Skin: Warm and dry without trophic changes noted. There are no varicose veins.  Musculoskeletal: exam reveals no obvious joint deformities, tenderness or joint swelling or erythema.   Neurologically:  Mental status: The patient is awake, alert and oriented in all 4 spheres. His immediate and remote memory, attention, language skills and fund of knowledge are appropriate. There is no evidence of aphasia, agnosia, apraxia or anomia. Speech is clear with normal prosody and enunciation. Thought process is linear. Mood is anxious and constricted and affect is blunted.  Cranial nerves II - XII are as described above under  HEENT exam. In addition: shoulder shrug is normal with equal shoulder height noted. Motor exam: Normal bulk, strength and tone is noted. There is no drift, tremor or rebound. Romberg is negative. Reflexes are 2+ throughout. Babinski: Toes are flexor bilaterally. Fine motor skills and coordination: intact with normal finger taps, normal hand movements, normal rapid alternating patting, normal foot taps and normal foot agility.  Cerebellar testing: No dysmetria or intention tremor on finger to nose testing. Heel to shin is unremarkable bilaterally. There is no truncal or gait ataxia.  Sensory exam: intact to light touch in the upper and lower extremities.  Gait, station and balance: He stands easily. No veering to one side is noted. No leaning to one side is noted. Posture is age-appropriate and stance is narrow based. Gait shows normal stride length and normal pace. No problems turning are noted. He turns en bloc. Tandem walk is difficult for him, unchanged.   Assessment and Plan:   In summary, Stuart Huynh is a very pleasant 74 year old male with an underlying medical history of anxiety, insomnia, hypertension, mild obstructive sleep apnea, lumbar disc disease, presents for follow-up consultation of his 2 episodes of transient alteration on awareness. His history and exam are in keeping with TGA/transient global amnesia, even though in most cases of TGA it is limited to a single episode. His exam and neurological exam are non-focal. Workup thus far has shown a tiny area of microhemorrhage in the left mesial temporal lobe, and a mildly abnormal EEG which could very well be in keeping with the area of microhemorrhage. Nevertheless, I would like to repeat his EEG down the road and repeat an MRI with contrast which he has not had before. He has not had any further spells which is reassuring. I have reiterated my recommendation for not driving until 6 months have elapsed since his last spell. I again had a  long chat with the patient and his wife about my findings and the diagnosis of TGA, the prognosis and treatment options. We talked about medical treatments and non-pharmacological approaches. There is no clear etiology for this and no specific treatment. We talked about maintaining a healthy lifestyle in general. I encouraged him to drink more water. He has done better with this lately. As far as further diagnostic testing is concerned, I suggested the following today: MRI brain w contrast and we will consider repeating the EEG down the road.  As far as medications are concerned, I recommended the following at this time: no change.  I answered all their questions  today and the patient and his wife were in agreement with the above outlined plan, although he is reluctant to pursue the MRI. I would like to see the patient back in 3 months, sooner if the need arises and encouraged them to call with any interim questions, concerns, problems or updates and we will call with his MRI test results.  I spent 25 minutes in total face-to-face time with the patient, more than 50% of which was spent in counseling and coordination of care, reviewing test results, reviewing medication and discussing or reviewing the diagnosis of TGA, its prognosis and treatment options.

## 2015-03-02 ENCOUNTER — Telehealth: Payer: Self-pay | Admitting: Neurology

## 2015-03-02 NOTE — Telephone Encounter (Signed)
Patient says he is returning a call.

## 2015-03-02 NOTE — Telephone Encounter (Signed)
We have not called patient, per Larene Beach: [facility] called him he just needs to return their call [phone].   I spoke with patient and he is aware of above information.

## 2015-03-15 ENCOUNTER — Ambulatory Visit
Admission: RE | Admit: 2015-03-15 | Discharge: 2015-03-15 | Disposition: A | Discharge status: Home / Self Care | Payer: Medicare PPO | Source: Ambulatory Visit | Attending: Neurology | Admitting: Neurology

## 2015-03-15 DIAGNOSIS — R9401 Abnormal electroencephalogram [EEG]: Secondary | ICD-10-CM

## 2015-03-15 DIAGNOSIS — G454 Transient global amnesia: Secondary | ICD-10-CM | POA: Diagnosis not present

## 2015-03-15 DIAGNOSIS — R9089 Other abnormal findings on diagnostic imaging of central nervous system: Secondary | ICD-10-CM

## 2015-03-15 MED ORDER — GADOBENATE DIMEGLUMINE 529 MG/ML IV SOLN
17.0000 mL | Freq: Once | INTRAVENOUS | Status: AC | PRN
  Administered 2015-03-15: 17 mL via INTRAVENOUS

## 2015-03-16 NOTE — Progress Notes (Signed)
Quick Note:  Brain MRI w contrast reported as normal. Pls notify patient or wife, thx Star Age, MD, PhD [facility] ([facility]  ______

## 2015-03-17 NOTE — Progress Notes (Signed)
Quick Note:  I called and LMVM home that MRI normal study. They are to call back if needed. ______

## 2015-03-27 ENCOUNTER — Other Ambulatory Visit: Payer: Self-pay

## 2015-05-15 ENCOUNTER — Ambulatory Visit (INDEPENDENT_AMBULATORY_CARE_PROVIDER_SITE_OTHER): Payer: Medicare PPO | Admitting: Neurology

## 2015-05-15 ENCOUNTER — Telehealth: Payer: Self-pay

## 2015-05-15 ENCOUNTER — Encounter: Payer: Self-pay | Admitting: Neurology

## 2015-05-15 VITALS — BP 142/70 | HR 62 | Resp 16 | Ht 71.0 in | Wt 178.0 lb

## 2015-05-15 DIAGNOSIS — R9401 Abnormal electroencephalogram [EEG]: Secondary | ICD-10-CM | POA: Diagnosis not present

## 2015-05-15 DIAGNOSIS — G454 Transient global amnesia: Secondary | ICD-10-CM | POA: Diagnosis not present

## 2015-05-15 DIAGNOSIS — R404 Transient alteration of awareness: Secondary | ICD-10-CM | POA: Diagnosis not present

## 2015-05-15 NOTE — Progress Notes (Signed)
Subjective:    Patient ID: Stuart Huynh is a 74 y.o. male.  HPI     Interim history:   Stuart Huynh is a 74 year old right-handed gentleman with an underlying medical history of anxiety, insomnia, hypertension, mild obstructive sleep apnea, lumbar disc disease, who presents for follow-up consultation of his history of transient global amnesia. The patient is unaccompanied today. I last saw him on 02/07/2015, at which time we talked about his brain MRI without contrast. He reported feeling stable. He had brain MRI without contrast on 01/08/2015:No acute intracranial process, specifically no acute ischemia. 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 In particular, he had no spells of altered consciousness or confusion or changes in his recent medication history her medical history. He was trying to drink more water. I advised him again not to drive until he had been spells free for 6 months. I suggested we bring him back for an MRI brain with contrast. He had this on 03/15/2015 and this was reported as normal. We called him with his test results. I suggested we repeat his EEG down the road as well.  Today, 05/15/2015: He reports doing well. He reports no recent changes in his medications. He reports no recent spells of confusion. He feels fine. He is trying to drink more water. He is active physically. He says he has limited his driving but actually drove himself to this appointment. I did remind him that the recommendation was to not drive until he was 6 months spell-free which would be in October of this year. We had a discussion about driving twice before during his appointments. His wife was there for both other appointments.  Previously:    I first met him on 01/16/2015 at the request of his primary care physician, at which time the patient reported a diagnosis of transient global amnesia. He was seen in the emergency room on 01/08/2015. I  suggested another EEG and consider another MRI but did not order an MRI at the time. He was advised not to drive until we have evidence that he does not have any spells for at least 6 months. He had an EEG on 01/23/2015: This is an abnormal EEG recording secondary to very mild intermittent theta slowing emanating from the left frontotemporal area. This study suggests a left brain abnormality, but does not clearly reveal epileptiform discharges. Clinical correlation is required. The EKG monitor throughout the recording shows a very irregular heart rhythm.   I reviewed his emergency room records from 01/08/2015 as well as prior hospitalization records from his admission from 07/31/2014 through 08/01/2014. At that time he was brought in by his family to the emergency room because of disorientation and difficulty recognizing even family members. He was not able to recall what he had done in the morning and the day before. Head CT in the emergency room was negative for an acute changes. I personally reviewed the noncontrast head CT from 07/31/2014 which per my review did not show any acute changes. Neurological consult was obtained and an MRI brain and EEG were recommended. He was admitted to the hospital. EEG was normal in the awake state. CT angiography head and neck was negative. I reviewed his brain MRI without contrast from 08/01/2014: No acute infarct. Mild small vessel disease type changes. Paranasal sinus mucosal thickening as noted above. In addition, personally reviewed the images through the PACS system. 2-D echocardiogram on 08/01/2014 showed EF of 55-60%.  On 01/08/2015 he had a noncontrast head CT in the Emergency room. I personally reviewed the images through the PACS system.   He was observed in the emergency room and returned back to baseline, after about 4 hours.   While he feels at baseline, he has had some difficulty with motivation and feels "vulnerable". He has been on Zoloft for years and  Trazodone 150 mg for sleep for 8-10 years.   His wife provides additional information.   He saw cardiology with w/u including 30 day holter, which per wife was negative.    His Past Medical History Is Significant For: Past Medical History  Diagnosis Date  . Hypertension   . Depression   . Irritable bowel syndrome (IBS)   . Hernia, hiatal     herniated disc in spine, produces numbness in left ankle has had for years per patient  . Transient global amnesia   . Lumbar herniated disc   . BPH (benign prostatic hyperplasia)   . Anxiety   . Insomnia   . Skin cancer     His Past Surgical History Is Significant For: Past Surgical History  Procedure Laterality Date  . Skin cancer excision Right cheek  . Transurethral resection of prostate    . Cystectomy      His Family History Is Significant For: Family History  Problem Relation Age of Onset  . Multiple myeloma Mother   . Lung cancer Father   . Alcohol abuse Father   . Leukemia Brother   . Deep vein thrombosis Brother     His Social History Is Significant For: Social History   Social History  . Marital Status: Married    Spouse Name: N/A  . Number of Children: 4  . Years of Education: PhD   Occupational History  . Retired    Social History Main Topics  . Smoking status: Former Smoker    Types: Cigarettes  . Smokeless tobacco: Never Used     Comment: Quit in 1968  . Alcohol Use: 0.0 oz/week    0 Standard drinks or equivalent per week     Comment: occasional beer  . Drug Use: No  . Sexual Activity: Not Asked     Comment: quit smoking about 50 years ago   Other Topics Concern  . None   Social History Narrative   2 cups of coffee a week     His Allergies Are:  Allergies  Allergen Reactions  . Sulfa Antibiotics Rash  :   His Current Medications Are:  Outpatient Encounter Prescriptions as of 05/15/2015  Medication Sig  . ferrous fumarate (HEMOCYTE - 106 MG FE) 325 (106 FE) MG TABS tablet Take 1 tablet  by mouth.  . Multiple Vitamin (MULTIVITAMIN WITH MINERALS) TABS tablet Take 1 tablet by mouth daily.  . psyllium (METAMUCIL) 58.6 % powder Take 1 packet by mouth 4 (four) times daily as needed.  . sertraline (ZOLOFT) 100 MG tablet Take 100 mg by mouth daily.  . traZODone (DESYREL) 150 MG tablet Take 75 mg by mouth at bedtime.   No facility-administered encounter medications on file as of 05/15/2015.  :  Review of Systems:  Out of a complete 14 point review of systems, all are reviewed and negative with the exception of these symptoms as listed below:   Review of Systems  All other systems reviewed and are negative.   Objective:  Neurologic Exam  Physical Exam Physical Examination:   Filed Vitals:   05/15/15 1327  BP:  142/70  Pulse: 62  Resp: 16   General Examination: The patient is a very pleasant 74 y.o. male in no acute distress. He appears well-developed and well-nourished and well groomed.  HEENT: Normocephalic, atraumatic, pupils are equal, round and reactive to light and accommodation. Funduscopic exam is normal with sharp disc margins noted. Extraocular tracking is good without limitation to gaze excursion or nystagmus noted. Normal smooth pursuit is noted. Hearing is grossly intact. Tympanic membranes are clear bilaterally. Face is symmetric with normal facial animation and normal facial sensation. Speech is clear with no dysarthria noted. There is no hypophonia. There is no lip, neck/head, jaw or voice tremor. Neck is supple with full range of passive and active motion. There are no carotid bruits on auscultation. Oropharynx exam reveals: mild mouth dryness, adequate dental hygiene and mild airway crowding. Mallampati is class II. Tongue protrudes centrally and palate elevates symmetrically.    Chest: Clear to auscultation without wheezing, rhonchi or crackles noted.  Heart: S1+S2+0, regular and normal without murmurs, rubs or gallops noted.   Abdomen: Soft, non-tender and  non-distended with normal bowel sounds appreciated on auscultation.  Extremities: There is trace pitting edema in the distal lower extremities bilaterally, L more than R. Pedal pulses are intact.  Skin: Warm and dry without trophic changes noted. There are no varicose veins.  Musculoskeletal: exam reveals no obvious joint deformities, tenderness or joint swelling or erythema.   Neurologically:  Mental status: The patient is awake, alert and oriented in all 4 spheres. His immediate and remote memory, attention, language skills and fund of knowledge are appropriate. There is no evidence of aphasia, agnosia, apraxia or anomia. Speech is clear with normal prosody and enunciation. Thought process is linear. Mood is normal and affect is normal to slightly blunted.  Cranial nerves II - XII are as described above under HEENT exam. In addition: shoulder shrug is normal with equal shoulder height noted. Motor exam: Normal bulk, strength and tone is noted. There is no drift, tremor or rebound. Romberg is negative. Reflexes are 2+ throughout. Babinski: Toes are flexor bilaterally. Fine motor skills and coordination: intact with normal finger taps, normal hand movements, normal rapid alternating patting, normal foot taps and normal foot agility.  Cerebellar testing: No dysmetria or intention tremor on finger to nose testing. Heel to shin is unremarkable bilaterally. There is no truncal or gait ataxia.  Sensory exam: intact to light touch in the upper and lower extremities.  Gait, station and balance: He stands easily. No veering to one side is noted. No leaning to one side is noted. Posture is age-appropriate and stance is narrow based. Gait shows normal stride length and normal pace. No problems turning are noted. He turns en bloc. Tandem walk is difficult for him, unchanged from both other visits.   Assessment and Plan:   In summary, Stuart Huynh is a very pleasant 74 year old male with an underlying  medical history of anxiety, insomnia, hypertension, mild obstructive sleep apnea, lumbar disc disease, presents for follow-up consultation of his 2 episodes of transient alteration on awareness. His history and exam are in keeping with TGA/transient global amnesia, even though in most cases of TGA it is limited to a single episode. His general and neurological exam continue to be non-focal. Workup thus far has shown a tiny area of microhemorrhage in the left mesial temporal lobe, and a mildly abnormal EEG which could very well be in keeping with the area of microhemorrhage. Nevertheless, I would like to  repeat his EEG down the road and we did repeat an MRI brain with contrast which was normal. I have reiterated my recommendation for not driving until 6 months have elapsed since his last spell. I suggested we repeat his EEG in October. We will call him with his test results. I can see him back as needed. He is reminded to stay well-hydrated. No new medications are recommended at this time. I answered all his questions today and the patient was in agreement.  I spent 15 minutes in total face-to-face time with the patient, more than 50% of which was spent in counseling and coordination of care, reviewing test results, reviewing medication and discussing or reviewing the diagnosis of TGA, its prognosis and treatment options.

## 2015-05-15 NOTE — Telephone Encounter (Signed)
Opened in error

## 2015-05-15 NOTE — Patient Instructions (Signed)
Please stay well hydrated. Exam looks good. As a reminder, do not drive till 6 months out from your ER visit, which was on 01/08/15. We will request another EEG, brain wave test for October for comparison and we will call you with the results. You can follow up as needed.

## 2015-07-03 ENCOUNTER — Ambulatory Visit (INDEPENDENT_AMBULATORY_CARE_PROVIDER_SITE_OTHER): Payer: Medicare PPO | Admitting: Neurology

## 2015-07-03 DIAGNOSIS — R404 Transient alteration of awareness: Secondary | ICD-10-CM

## 2015-07-03 DIAGNOSIS — G454 Transient global amnesia: Secondary | ICD-10-CM

## 2015-07-03 DIAGNOSIS — R9401 Abnormal electroencephalogram [EEG]: Secondary | ICD-10-CM

## 2015-07-03 DIAGNOSIS — R410 Disorientation, unspecified: Secondary | ICD-10-CM

## 2015-07-07 NOTE — Procedures (Signed)
   HISTORY: 74 yo male with history of transient global amnesia in April 2016.  TECHNIQUE:  16 channel EEG was performed based on standard 10-16 international system. One channel was dedicated to EKG, which has demonstrates irregular heart rate of 54 beats per minutes.  Upon awakening, the posterior background activity was well-developed, in alpha range 9Hz , reactive to eye opening and closure.  There was intermittent left hemisphere sharp transient with phase reversal at T3, P3, T5, F7, with limited field.  Photic stimulation was performed, which induced a symmetric photic driving.  Hyperventilation was performed, there was increased intermittent left hemisphere sharp transient post hyperventilation.  No sleep was achieved.  CONCLUSION: This is an abnormal EEG.  There is  evidence of intermittent sharp transients involving T3, T5, P3, F7, indicating left hemisphere irritability.

## 2015-07-07 NOTE — Progress Notes (Signed)
Quick Note:  Please call patient or his wife: We repeated his electrical brainwave test called EEG and it again showed a mild abnormality on the left side, in keeping with irritability of the left side of the brain. We should probably keep an eye on him clinically. I had suggested an as needed follow-up last time as he was doing well. I would like to suggest a follow-up to monitor symptoms. Please offer him an appointment in a couple months, early January. Please ask him to make a sooner appointment if he has any new concerns or spells similar to ones he has had. Findings on the EEG are not in keeping with actual seizure activity, but are in keeping with left sided brain irritability which could in the right setting become a focus of seizures. I would like to remind him to stay well rested, stay well-hydrated, and have good nutrition, avoid skipping meals or significant blood sugar fluctuations, avoid exhaustion or overheating. Please assist him with a follow-up appointment.  Stuart Age, MD, PhD [facility] ([facility])  ______

## 2015-07-10 ENCOUNTER — Telehealth: Payer: Self-pay

## 2015-07-10 NOTE — Telephone Encounter (Signed)
-----   Message from Star Age, MD sent at 07/07/2015 10:25 AM EDT ----- Please call patient or his wife: We repeated his electrical brainwave test called EEG and it again showed a mild abnormality on the left side, in keeping with irritability of the left side of the brain. We should probably keep an eye on him clinically. I had suggested an as needed follow-up last time as he was doing well. I would like to suggest a follow-up to monitor symptoms. Please offer him an appointment in a couple months, early January. Please ask him to make a sooner appointment if he has any new concerns or spells similar to ones he has had. Findings on the EEG are not in keeping with actual seizure activity, but are in keeping with left sided brain irritability which could in the right setting become a focus of seizures. I would like to remind him to stay well rested, stay well-hydrated, and have good nutrition, avoid skipping meals or significant blood sugar fluctuations, avoid exhaustion or overheating. Please assist him with a follow-up appointment.  Star Age, MD, PhD [facility])

## 2015-07-10 NOTE — Telephone Encounter (Signed)
Left message for patient to call back  

## 2015-07-13 NOTE — Telephone Encounter (Signed)
I spoke to patient and gave results and recommendation. He voiced understanding. We were able to make appt in January.

## 2015-10-17 ENCOUNTER — Ambulatory Visit: Payer: Self-pay | Admitting: Neurology

## 2015-10-23 ENCOUNTER — Ambulatory Visit (INDEPENDENT_AMBULATORY_CARE_PROVIDER_SITE_OTHER): Payer: Medicare PPO | Admitting: Neurology

## 2015-10-23 ENCOUNTER — Encounter: Payer: Self-pay | Admitting: Neurology

## 2015-10-23 VITALS — BP 146/70 | HR 60 | Resp 16 | Ht 71.0 in | Wt 177.0 lb

## 2015-10-23 DIAGNOSIS — R404 Transient alteration of awareness: Secondary | ICD-10-CM

## 2015-10-23 DIAGNOSIS — R930 Abnormal findings on diagnostic imaging of skull and head, not elsewhere classified: Secondary | ICD-10-CM | POA: Diagnosis not present

## 2015-10-23 DIAGNOSIS — R9401 Abnormal electroencephalogram [EEG]: Secondary | ICD-10-CM

## 2015-10-23 DIAGNOSIS — G454 Transient global amnesia: Secondary | ICD-10-CM

## 2015-10-23 DIAGNOSIS — R9089 Other abnormal findings on diagnostic imaging of central nervous system: Secondary | ICD-10-CM

## 2015-10-23 NOTE — Patient Instructions (Signed)
We can see you back as needed.  Your exam is stable.  Your EEG showed mild irritability on the left side of your brain, but no frank seizure like activity.  As discussed seizure triggers are: Sleep deprivation, dehydration, overheating, stress, hypoglycemia or skipping meals, certain medications or excessive alcohol use, especially stopping alcohol abruptly if you have had heavy alcohol use before (aka alcohol withdrawal seizure). Avoid taking Wellbutrin, narcotic pain medications and tramadol, as they can lower seizure threshold.  As of right now, you don't have a seizure disorder, but you may have a lowered seizure threshold in the right circumstance (see above).

## 2015-10-23 NOTE — Progress Notes (Signed)
Subjective:    Patient ID: Stuart Huynh is a 75 y.o. male.  HPI     Interim history:   Stuart Huynh is a 75 year old right-handed gentleman with an underlying medical history of anxiety, insomnia, hypertension, mild obstructive sleep apnea, lumbar disc disease, who presents for follow-up consultation of his history of transient global amnesia. The patient is unaccompanied today. I last saw him on 05/15/2015, at which time he reported doing well. He had no recent changes in his medical history, no recent spells of confusion, no recent medication changes, and reported being active physically and trying to drink enough water. He had limited his driving but had driven himself to the appointment. He was reminded that the recommendation had been to not drive until 6 months after his spell as long as he was free of any additional spells of confusion and this would've been October 2016. I suggested we repeat his EEG. He had an EEG on 07/03/2015: CONCLUSION: This is an abnormal EEG.  There is  evidence of intermittent sharp transients involving T3, T5, P3, F7, indicating left hemisphere irritability.  We called him with his test results and I suggested a 3 month follow-up.  Today, 10/23/2015: He reports doing well. No recent spells, no new complaints. Drives without problems, has a remote history in college of syncope. He was sitting down, eating pizza with his friends at the time. He had loss of consciousness for a few seconds or maybe a minute. He has had exposure to mercury as a child he says.   Previously:    I saw him on 02/07/2015, at which time we talked about his brain MRI without contrast. He reported feeling stable. He had brain MRI without contrast on 01/08/2015:No acute intracranial process, specifically no acute ischemia. 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 In particular, he had no spells of altered consciousness or confusion or  changes in his recent medication history her medical history. He was trying to drink more water. I advised him again not to drive until he had been spells free for 6 months. I suggested we bring him back for an MRI brain with contrast. He had this on 03/15/2015 and this was reported as normal. We called him with his test results. I suggested we repeat his EEG down the road as well.  I first met him on 01/16/2015 at the request of his primary care physician, at which time the patient reported a diagnosis of transient global amnesia. He was seen in the emergency room on 01/08/2015. I suggested another EEG and consider another MRI but did not order an MRI at the time. He was advised not to drive until we have evidence that he does not have any spells for at least 6 months. He had an EEG on 01/23/2015: This is an abnormal EEG recording secondary to very mild intermittent theta slowing emanating from the left frontotemporal area. This study suggests a left brain abnormality, but does not clearly reveal epileptiform discharges. Clinical correlation is required. The EKG monitor throughout the recording shows a very irregular heart rhythm.   I reviewed his emergency room records from 01/08/2015 as well as prior hospitalization records from his admission from 07/31/2014 through 08/01/2014. At that time he was brought in by his family to the emergency room because of disorientation and difficulty recognizing even family members. He was not able to recall what he had done in the morning and the day before.  Head CT in the emergency room was negative for an acute changes. I personally reviewed the noncontrast head CT from 07/31/2014 which per my review did not show any acute changes. Neurological consult was obtained and an MRI brain and EEG were recommended. He was admitted to the hospital. EEG was normal in the awake state. CT angiography head and neck was negative. I reviewed his brain MRI without contrast from  08/01/2014: No acute infarct. Mild small vessel disease type changes. Paranasal sinus mucosal thickening as noted above. In addition, personally reviewed the images through the PACS system. 2-D echocardiogram on 08/01/2014 showed EF of 55-60%.  On 01/08/2015 he had a noncontrast head CT in the Emergency room. I personally reviewed the images through the PACS system.   He was observed in the emergency room and returned back to baseline, after about 4 hours.   While he feels at baseline, he has had some difficulty with motivation and feels "vulnerable". He has been on Zoloft for years and Trazodone 150 mg for sleep for 8-10 years.   His wife provides additional information.   He saw cardiology with w/u including 30 day holter, which per wife was negative.    His Past Medical History Is Significant For: Past Medical History  Diagnosis Date  . Hypertension   . Depression   . Irritable bowel syndrome (IBS)   . Hernia, hiatal     herniated disc in spine, produces numbness in left ankle has had for years per patient  . Transient global amnesia   . Lumbar herniated disc   . BPH (benign prostatic hyperplasia)   . Anxiety   . Insomnia   . Skin cancer     His Past Surgical History Is Significant For: Past Surgical History  Procedure Laterality Date  . Skin cancer excision Right cheek  . Transurethral resection of prostate    . Cystectomy      His Family History Is Significant For: Family History  Problem Relation Age of Onset  . Multiple myeloma Mother   . Lung cancer Father   . Alcohol abuse Father   . Leukemia Brother   . Deep vein thrombosis Brother     His Social History Is Significant For: Social History   Social History  . Marital Status: Married    Spouse Name: N/A  . Number of Children: 4  . Years of Education: PhD   Occupational History  . Retired    Social History Main Topics  . Smoking status: Former Smoker    Types: Cigarettes  . Smokeless tobacco: Never  Used     Comment: Quit in 1968  . Alcohol Use: 0.0 oz/week    0 Standard drinks or equivalent per week     Comment: occasional beer  . Drug Use: No  . Sexual Activity: Not Asked     Comment: quit smoking about 50 years ago   Other Topics Concern  . None   Social History Narrative   2 cups of coffee a week     His Allergies Are:  Allergies  Allergen Reactions  . Sulfa Antibiotics Rash  :   His Current Medications Are:  Outpatient Encounter Prescriptions as of 10/23/2015  Medication Sig  . ferrous fumarate (HEMOCYTE - 106 MG FE) 325 (106 FE) MG TABS tablet Take 1 tablet by mouth once a week.   . Multiple Vitamin (MULTIVITAMIN WITH MINERALS) TABS tablet Take 1 tablet by mouth daily.  . sertraline (ZOLOFT) 100 MG tablet Take 100  mg by mouth daily.  . traZODone (DESYREL) 150 MG tablet Take 75 mg by mouth at bedtime.  . [DISCONTINUED] psyllium (METAMUCIL) 58.6 % powder Take 1 packet by mouth 4 (four) times daily as needed.   No facility-administered encounter medications on file as of 10/23/2015.  :  Review of Systems:  Out of a complete 14 point review of systems, all are reviewed and negative with the exception of these symptoms as listed below:   Review of Systems  Neurological:       Patient is here for f/u. No new concerns.     Objective:  Neurologic Exam  Physical Exam Physical Examination:   Filed Vitals:   10/23/15 1410  BP: 146/70  Pulse: 60  Resp: 16   General Examination: The patient is a very pleasant 75 y.o. male in no acute distress. He appears well-developed and well-nourished and well groomed. He is in good spirits today.  HEENT: Normocephalic, atraumatic, pupils are equal, round and reactive to light and accommodation. Funduscopic exam is normal with sharp disc margins noted. Mild b/l cataracts are noted. Extraocular tracking is good without limitation to gaze excursion or nystagmus noted. Normal smooth pursuit is noted. Hearing is grossly intact. Face  is symmetric with normal facial animation and normal facial sensation. Speech is clear with no dysarthria noted. There is no hypophonia. There is no lip, neck/head, jaw or voice tremor. Neck is supple with full range of passive and active motion. There are no carotid bruits on auscultation. Oropharynx exam reveals: mild mouth dryness, adequate dental hygiene and mild airway crowding. Mallampati is class II. Tongue protrudes centrally and palate elevates symmetrically.    Chest: Clear to auscultation without wheezing, rhonchi or crackles noted.  Heart: S1+S2+0, regular and normal without murmurs, rubs or gallops noted.   Abdomen: Soft, non-tender and non-distended with normal bowel sounds appreciated on auscultation.  Extremities: There is trace pitting edema in the distal lower extremities bilaterally, L more than R. Pedal pulses are intact.  Skin: Warm and dry without trophic changes noted. There are no varicose veins.  Musculoskeletal: exam reveals no obvious joint deformities, tenderness or joint swelling or erythema.   Neurologically:  Mental status: The patient is awake, alert and oriented in all 4 spheres. His immediate and remote memory, attention, language skills and fund of knowledge are appropriate. There is no evidence of aphasia, agnosia, apraxia or anomia. Speech is clear with normal prosody and enunciation. Thought process is linear. Mood is normal and affect is normal to slightly blunted.  Cranial nerves II - XII are as described above under HEENT exam. In addition: shoulder shrug is normal with equal shoulder height noted. Motor exam: Normal bulk, strength and tone is noted. There is no drift, tremor or rebound. Romberg is negative. Reflexes are 2+ throughout. Fine motor skills and coordination: intact with normal finger taps, normal hand movements, normal rapid alternating patting, normal foot taps and normal foot agility.  Cerebellar testing: No dysmetria or intention tremor on  finger to nose testing. Heel to shin is unremarkable bilaterally. There is no truncal or gait ataxia.  Sensory exam: intact to light touch in the upper and lower extremities.  Gait, station and balance: He stands easily. No veering to one side is noted. No leaning to one side is noted. Posture is age-appropriate and stance is narrow based. Gait shows normal stride length and normal pace. No problems turning are noted. He turns en bloc. Tandem walk is difficult for him in the  beginning, better than before.    Assessment and Plan:   In summary, DASEAN BROW is a very pleasant 75 year old male with an underlying medical history of anxiety, insomnia, hypertension, mild obstructive sleep apnea, lumbar disc disease, presents for follow-up consultation of his 2 episodes of transient alteration on awareness. His history and exam are in keeping with TGA/transient global amnesia, even though in most cases of TGA it is limited to a single episode. His general and neurological exam continue to be non-focal. Workup thus far has shown a tiny area of microhemorrhage in the left mesial temporal lobe, and a mildly abnormal EEG which could very well be in keeping with the area of microhemorrhage. Repeat EEG on 07/03/2015 showed ongoing evidence of mild irritability on the left side. He has had no further events. He has been driving. He has had no recent medication changes or new medical issues. I suggested that he follow-up with me on an as-needed basis. I did talk to him about the possibility that his irritability on the left side of his brain may suggest a lower seizure threshold. Therefore, I talked to him about seizure triggers today at length. He also has a remote history of syncope while in college. He may be at risk for repeat syncope in the future. He is advised to always stay well-hydrated and change positions slowly.  I answered all his questions today and the patient was in agreement with an as needed follow-up at  this time.  I spent 25 minutes in total face-to-face time with the patient, more than 50% of which was spent in counseling and coordination of care, reviewing test results, reviewing medication and discussing or reviewing the diagnosis of TGA, its prognosis and treatment options.

## 2016-10-03 IMAGING — CT CT HEAD W/O CM
1 series · 16 of 30 positions shown, 20 images · non-contrast
Comparison: CT 07/31/2005

CLINICAL DATA: Sudden onset of confusion.

EXAM:
CT HEAD WITHOUT CONTRAST
TECHNIQUE: Contiguous axial images were obtained from the base of the skull
through the vertex without intravenous contrast.

[Series 2: head 5.0 h30s · axial · 0.46mm/px · z∈[-118,+27]mm · 16 of 33 slices shown, 20 images]
[im 2/33  brain]
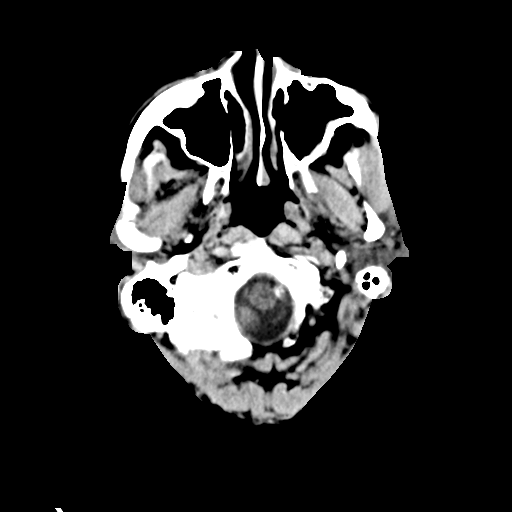
[im 2/33  bone]
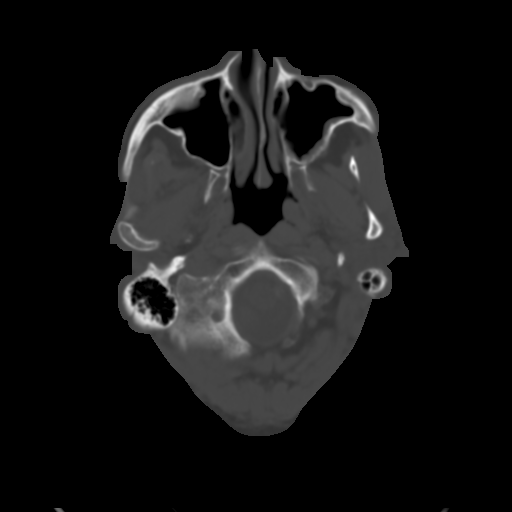
[im 4/33  brain]
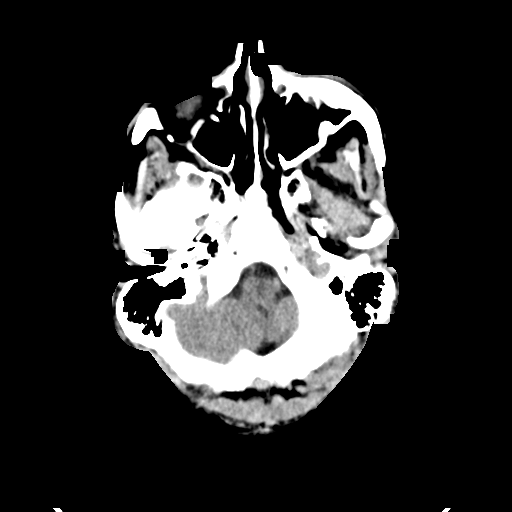
[im 6/33  brain]
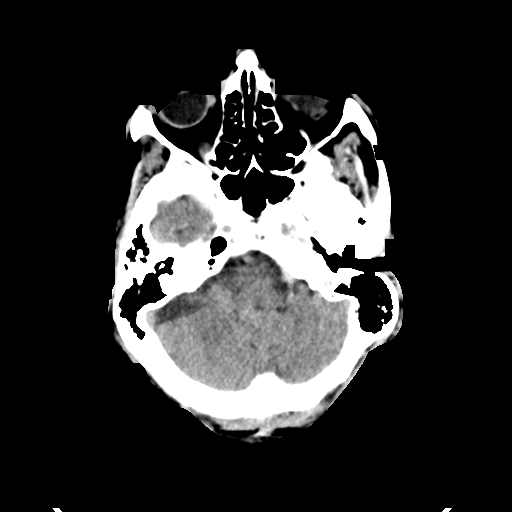
[im 8/33  brain]
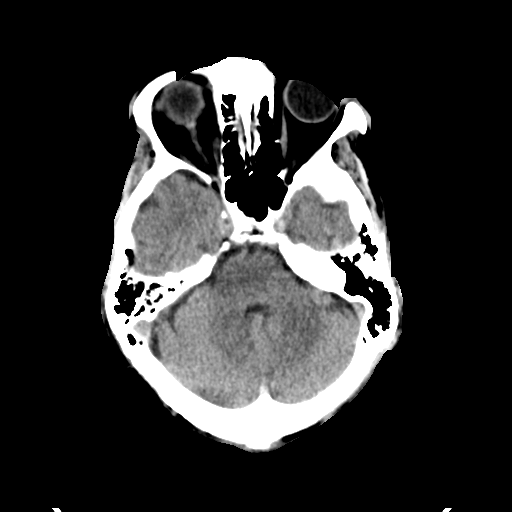
[im 9/33  brain]
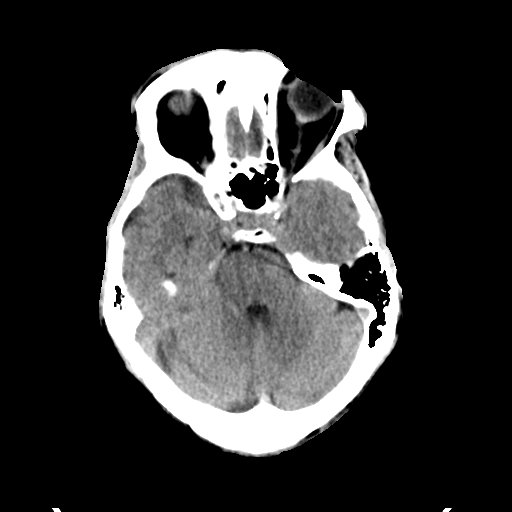
[im 9/33  bone]
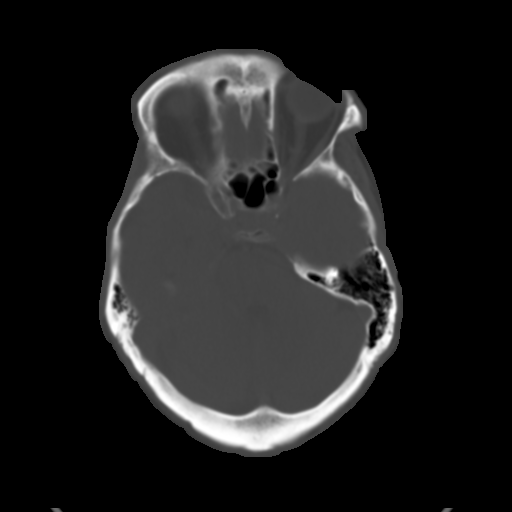
[im 12/33  brain]
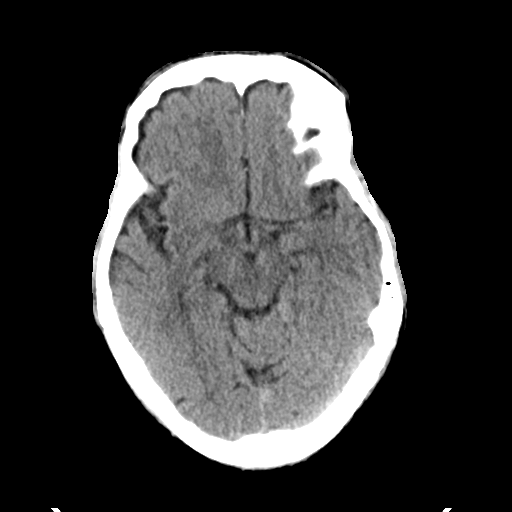
[im 14/33  brain]
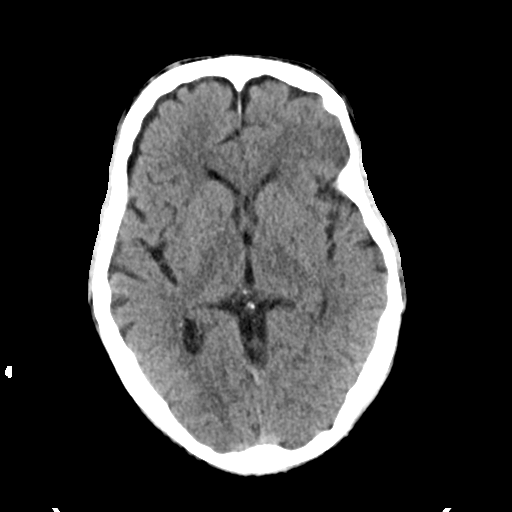
[im 16/33  brain]
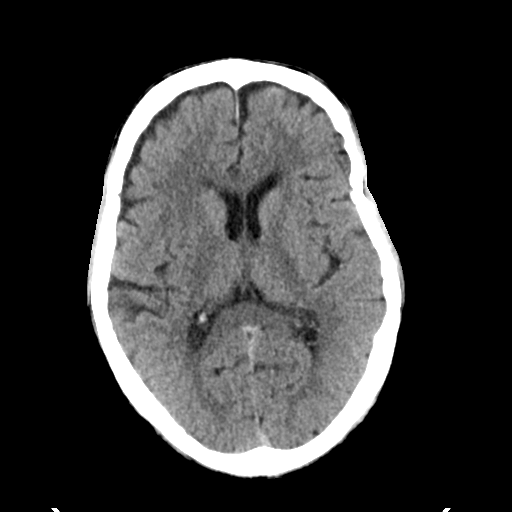
[im 17/33  brain]
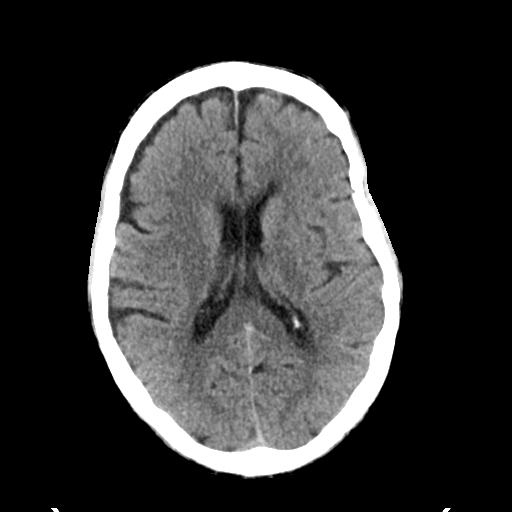
[im 17/33  bone]
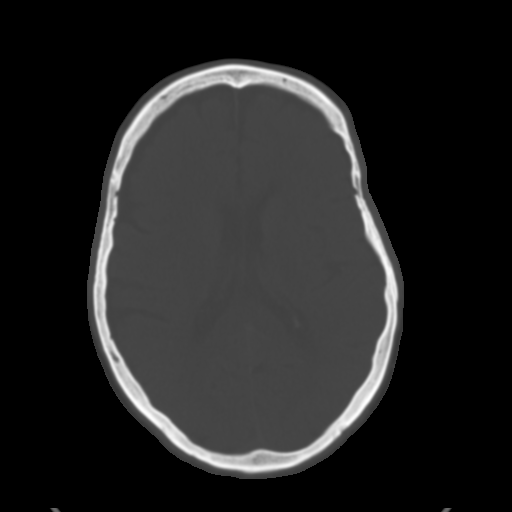
[im 19/33  brain]
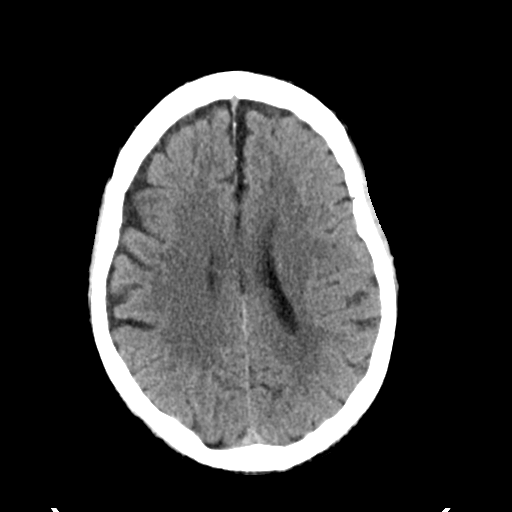
[im 21/33  brain]
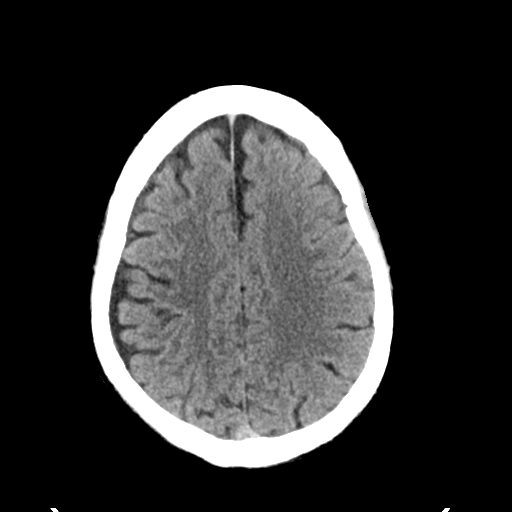
[im 24/33  brain]
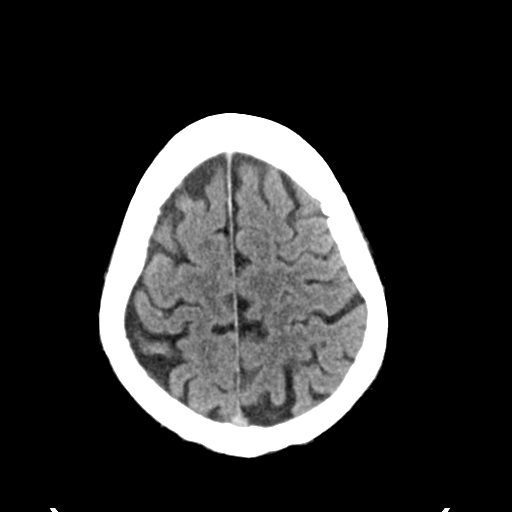
[im 25/33  brain]
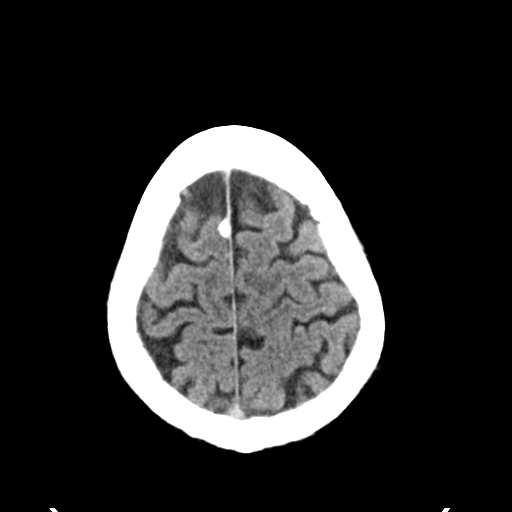
[im 25/33  bone]
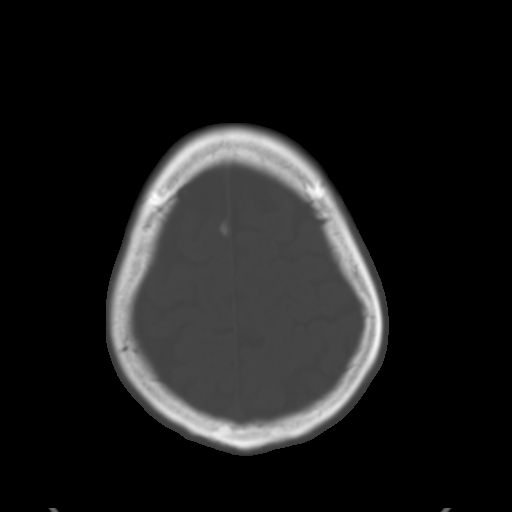
[im 27/33  brain]
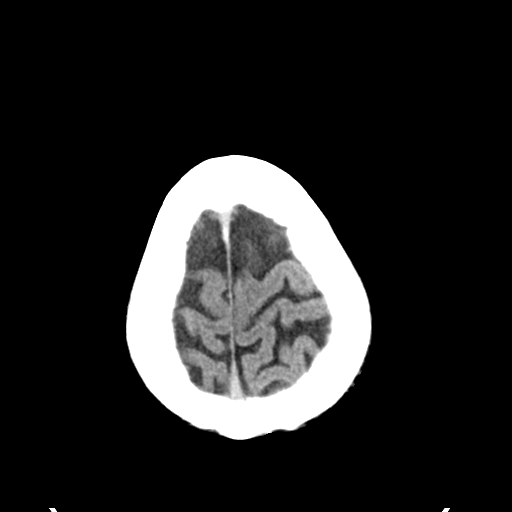
[im 29/33  brain]
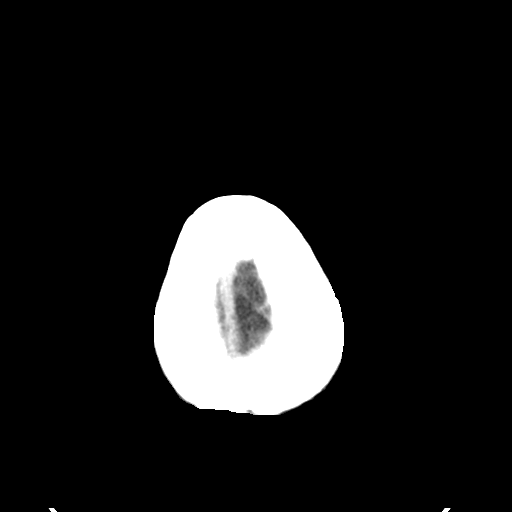
[im 31/33  brain]
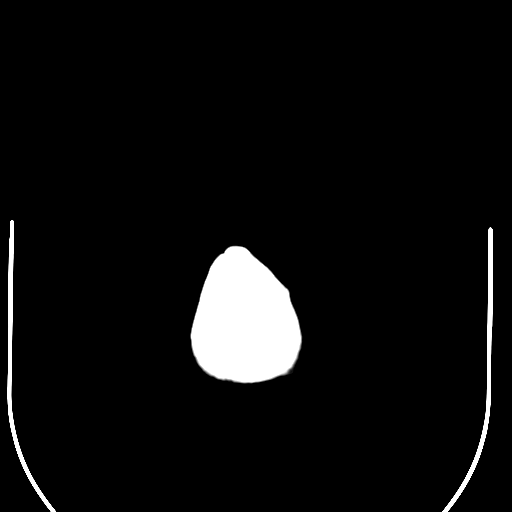

[16 of 30 positions shown; findings below may reference images not displayed]

FINDINGS: No intra-axial or extra-axial pathologic fluid or blood collection.
No mass lesion. No hydrocephalus. No hemorrhage. No acute bony
abnormality. Old nasal fractures. Mild mucosal thickening ethmoid or
maxillary sinuses.
IMPRESSION: No acute abnormality.

## 2016-11-14 DIAGNOSIS — G479 Sleep disorder, unspecified: Secondary | ICD-10-CM | POA: Diagnosis not present

## 2016-11-14 DIAGNOSIS — R197 Diarrhea, unspecified: Secondary | ICD-10-CM | POA: Diagnosis not present

## 2016-11-14 DIAGNOSIS — Z0000 Encounter for general adult medical examination without abnormal findings: Secondary | ICD-10-CM | POA: Diagnosis not present

## 2016-11-14 DIAGNOSIS — F325 Major depressive disorder, single episode, in full remission: Secondary | ICD-10-CM | POA: Diagnosis not present

## 2016-12-05 DIAGNOSIS — R197 Diarrhea, unspecified: Secondary | ICD-10-CM | POA: Diagnosis not present

## 2017-07-09 DIAGNOSIS — Z23 Encounter for immunization: Secondary | ICD-10-CM | POA: Diagnosis not present

## 2017-11-18 DIAGNOSIS — Z0000 Encounter for general adult medical examination without abnormal findings: Secondary | ICD-10-CM | POA: Diagnosis not present

## 2017-11-18 DIAGNOSIS — K529 Noninfective gastroenteritis and colitis, unspecified: Secondary | ICD-10-CM | POA: Diagnosis not present

## 2017-11-18 DIAGNOSIS — F419 Anxiety disorder, unspecified: Secondary | ICD-10-CM | POA: Diagnosis not present

## 2017-11-18 DIAGNOSIS — F325 Major depressive disorder, single episode, in full remission: Secondary | ICD-10-CM | POA: Diagnosis not present

## 2017-12-08 DIAGNOSIS — M542 Cervicalgia: Secondary | ICD-10-CM | POA: Diagnosis not present

## 2017-12-09 DIAGNOSIS — Z86010 Personal history of colonic polyps: Secondary | ICD-10-CM | POA: Diagnosis not present

## 2017-12-09 DIAGNOSIS — K529 Noninfective gastroenteritis and colitis, unspecified: Secondary | ICD-10-CM | POA: Diagnosis not present

## 2017-12-12 DIAGNOSIS — K52831 Collagenous colitis: Secondary | ICD-10-CM | POA: Diagnosis not present

## 2017-12-12 DIAGNOSIS — Z86010 Personal history of colonic polyps: Secondary | ICD-10-CM | POA: Diagnosis not present

## 2017-12-12 DIAGNOSIS — K5730 Diverticulosis of large intestine without perforation or abscess without bleeding: Secondary | ICD-10-CM | POA: Diagnosis not present

## 2017-12-16 DIAGNOSIS — K52831 Collagenous colitis: Secondary | ICD-10-CM | POA: Diagnosis not present

## 2018-01-08 DIAGNOSIS — K52831 Collagenous colitis: Secondary | ICD-10-CM | POA: Diagnosis not present

## 2018-03-11 DIAGNOSIS — H524 Presbyopia: Secondary | ICD-10-CM | POA: Diagnosis not present

## 2018-04-07 DIAGNOSIS — Z79899 Other long term (current) drug therapy: Secondary | ICD-10-CM | POA: Diagnosis not present

## 2018-04-07 DIAGNOSIS — K4090 Unilateral inguinal hernia, without obstruction or gangrene, not specified as recurrent: Secondary | ICD-10-CM | POA: Diagnosis not present

## 2018-04-08 DIAGNOSIS — K4020 Bilateral inguinal hernia, without obstruction or gangrene, not specified as recurrent: Secondary | ICD-10-CM | POA: Diagnosis not present

## 2018-05-23 DIAGNOSIS — L239 Allergic contact dermatitis, unspecified cause: Secondary | ICD-10-CM | POA: Diagnosis not present

## 2018-07-29 DIAGNOSIS — L301 Dyshidrosis [pompholyx]: Secondary | ICD-10-CM | POA: Diagnosis not present

## 2018-12-18 DIAGNOSIS — H6123 Impacted cerumen, bilateral: Secondary | ICD-10-CM | POA: Diagnosis not present

## 2018-12-18 DIAGNOSIS — H60333 Swimmer's ear, bilateral: Secondary | ICD-10-CM | POA: Diagnosis not present

## 2018-12-23 DIAGNOSIS — R231 Pallor: Secondary | ICD-10-CM | POA: Diagnosis not present

## 2018-12-23 DIAGNOSIS — R21 Rash and other nonspecific skin eruption: Secondary | ICD-10-CM | POA: Diagnosis not present

## 2019-03-16 DIAGNOSIS — F325 Major depressive disorder, single episode, in full remission: Secondary | ICD-10-CM | POA: Diagnosis not present

## 2019-03-16 DIAGNOSIS — R5383 Other fatigue: Secondary | ICD-10-CM | POA: Diagnosis not present

## 2019-03-16 DIAGNOSIS — G479 Sleep disorder, unspecified: Secondary | ICD-10-CM | POA: Diagnosis not present

## 2019-03-16 DIAGNOSIS — F419 Anxiety disorder, unspecified: Secondary | ICD-10-CM | POA: Diagnosis not present

## 2019-03-19 DIAGNOSIS — F325 Major depressive disorder, single episode, in full remission: Secondary | ICD-10-CM | POA: Diagnosis not present

## 2019-03-19 DIAGNOSIS — R5383 Other fatigue: Secondary | ICD-10-CM | POA: Diagnosis not present

## 2019-03-19 DIAGNOSIS — F419 Anxiety disorder, unspecified: Secondary | ICD-10-CM | POA: Diagnosis not present

## 2019-03-19 DIAGNOSIS — G479 Sleep disorder, unspecified: Secondary | ICD-10-CM | POA: Diagnosis not present

## 2019-07-27 ENCOUNTER — Encounter (INDEPENDENT_AMBULATORY_CARE_PROVIDER_SITE_OTHER): Payer: Self-pay
